# Patient Record
Sex: Male | Born: 1955 | Race: White | Hispanic: No | Marital: Married
Health system: Northeastern US, Academic
[De-identification: ages and names within clinical notes are randomized; demographics above are authoritative.]

---

## 2017-10-17 IMAGING — CR CHEST 2 VWS PA LAT
1 series · 2 of 2 positions shown · non-contrast
Comparison: None available

HISTORY/INDICATIONS: Dyspnea on exertion
TECHNIQUE: Chest 2 views.

[Series 1: pa · 0.17mm/px · 2 of 2 slices shown]
[im 1/2]
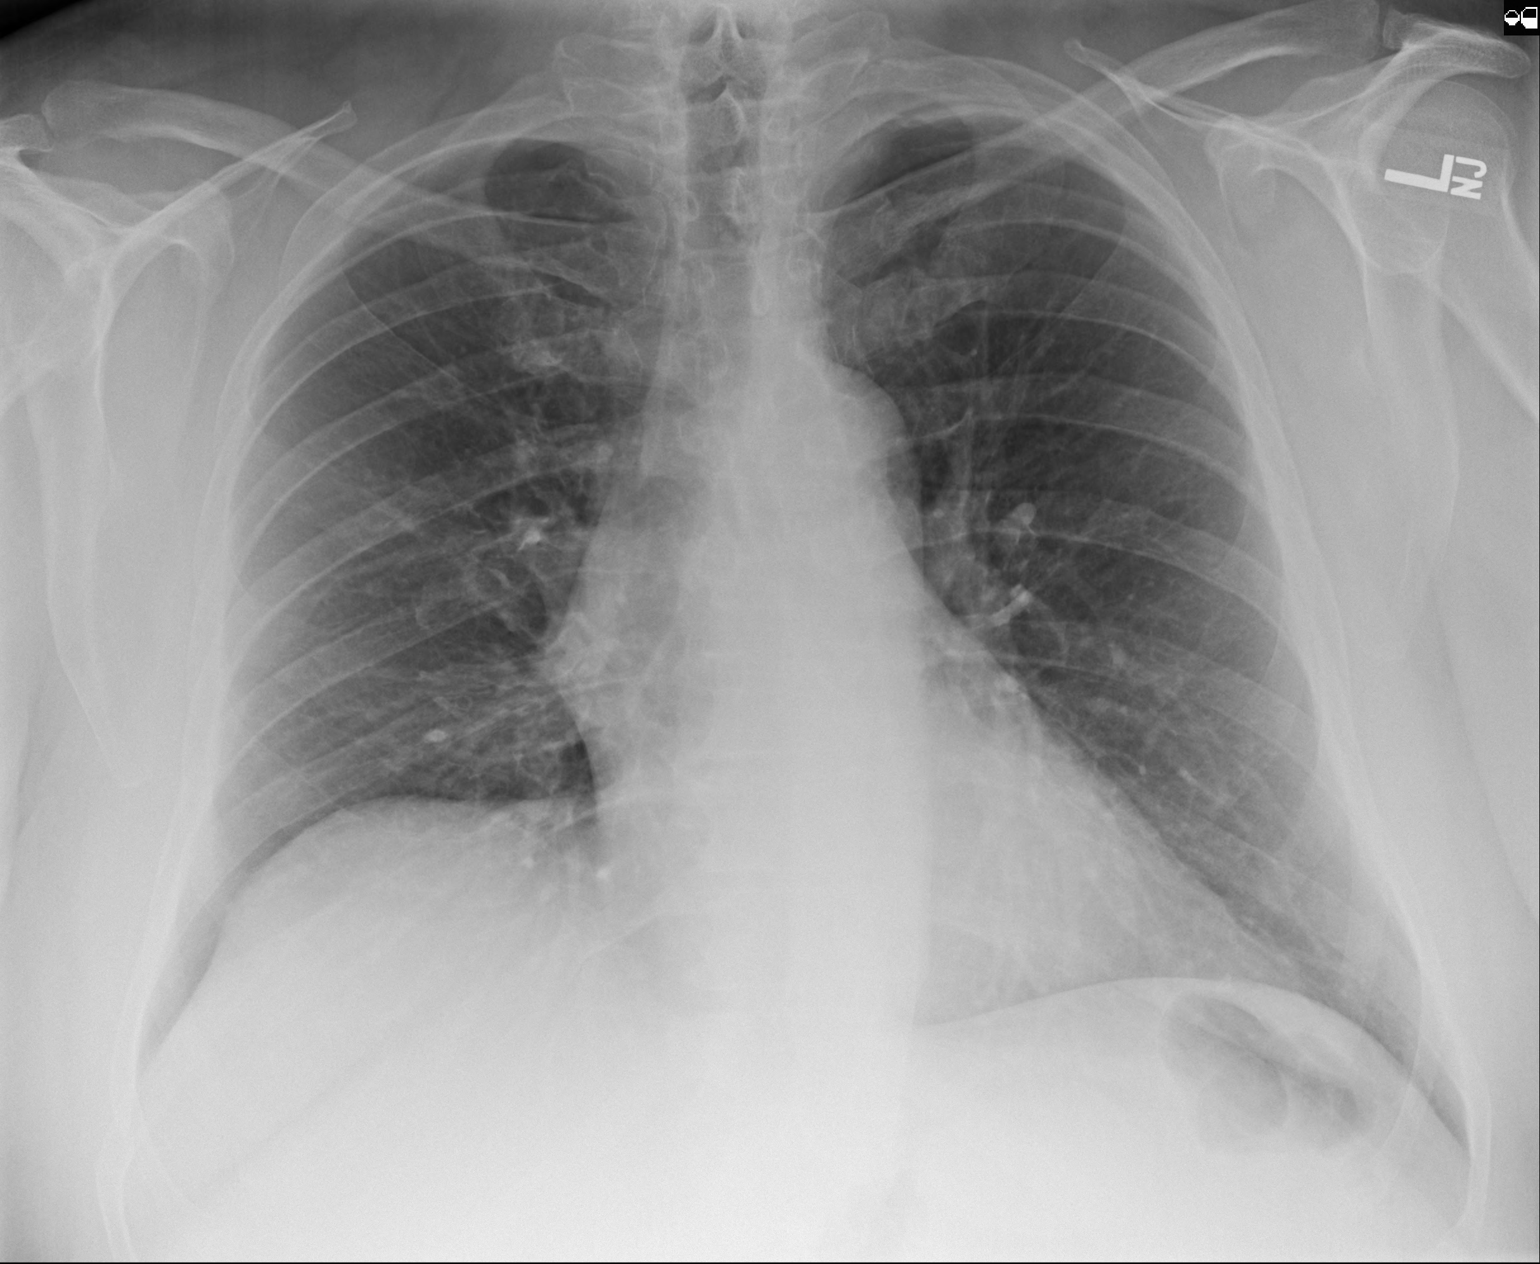
[im 2/2]
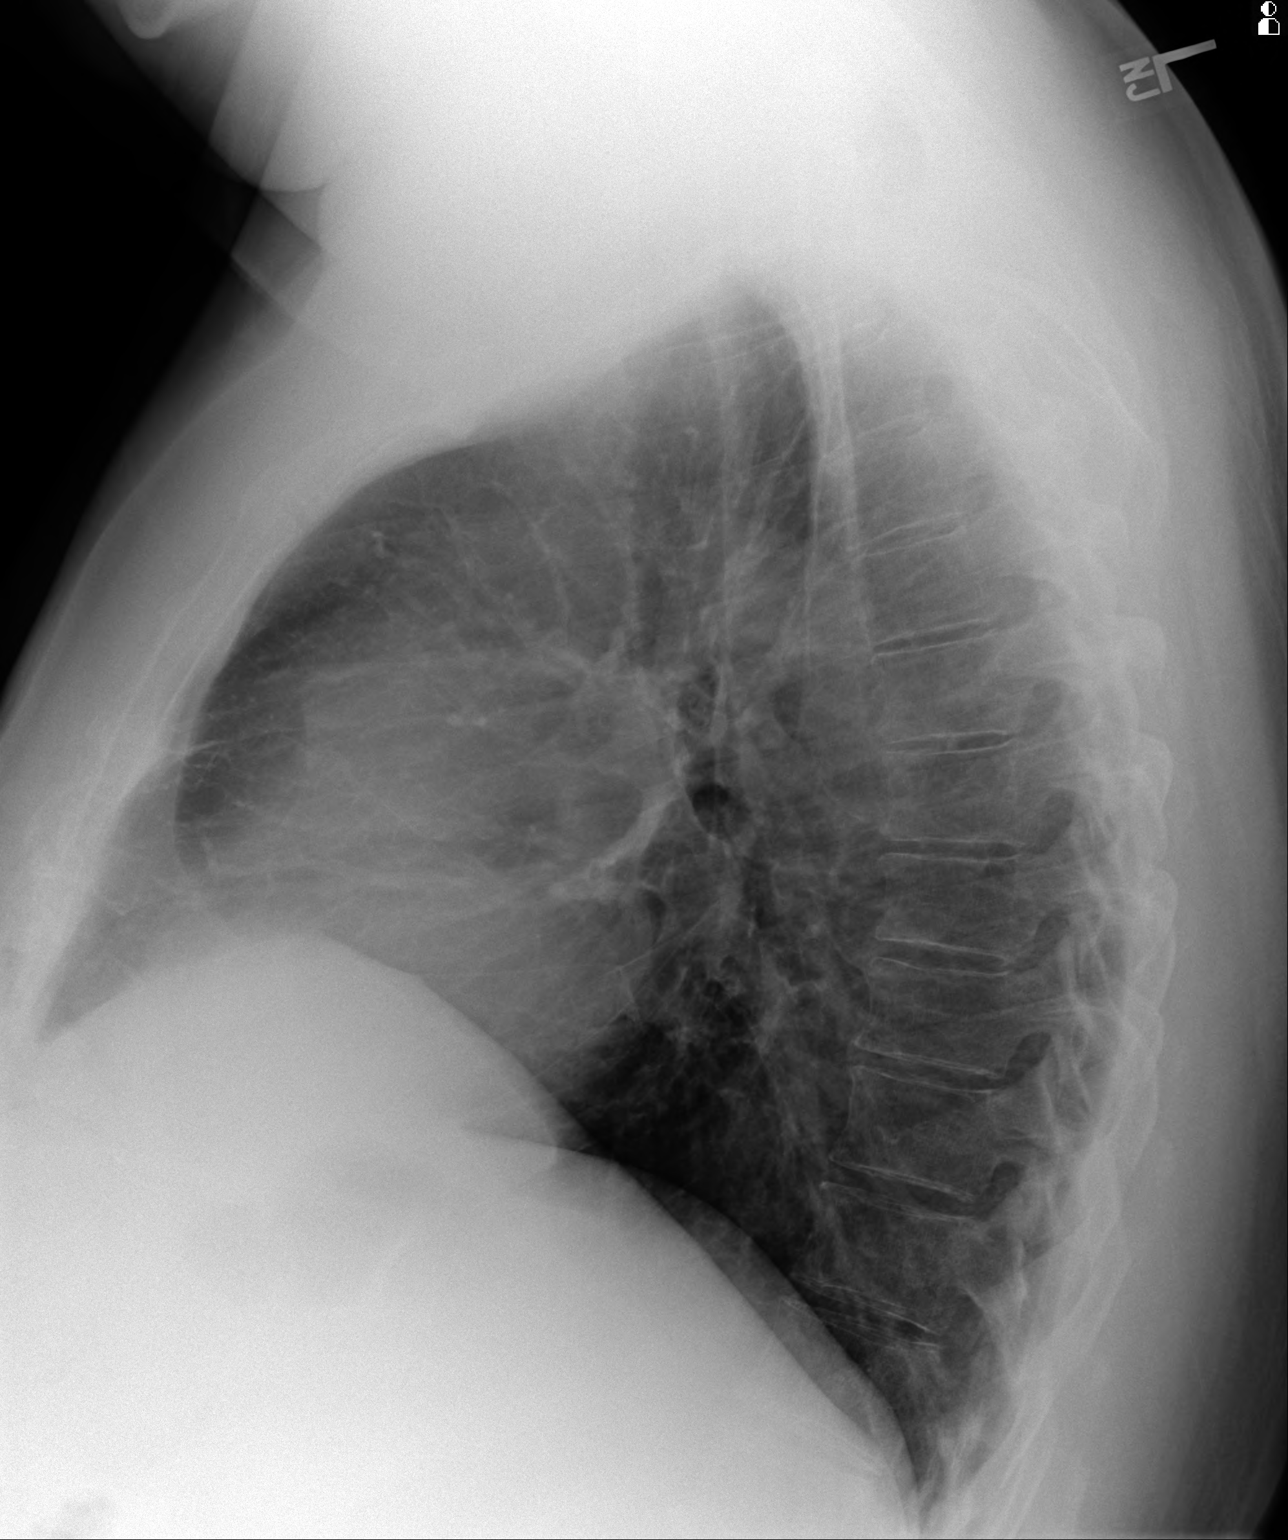

[2 of 2 positions shown; findings below may reference images not displayed]

FINDINGS: Heart size and pulmonary vasculature are normal. Lungs are clear bilaterally. There is no focal infiltrate, effusion or pneumothorax.

There is mild elevation of the right diaphragm.
IMPRESSION: No acute cardiopulmonary findings

## 2019-09-18 IMAGING — MR MRI KNEE LT WO CONTRAST
4 of 5 series · 29 of 40 positions shown · IV contrast (gadolinium)
Comparison: None

HISTORY: Left knee pain
TECHNIQUE: Multiplanar and multisequence MR imaging of the left knee was performed without the administration of intravenous gadolinium.

[Series 8: t2_axial_fs · axial · left · 3.0mm · 0.45mm/px · z∈[-51,+80]mm · 8 of 34 slices shown]
[im 1/34]
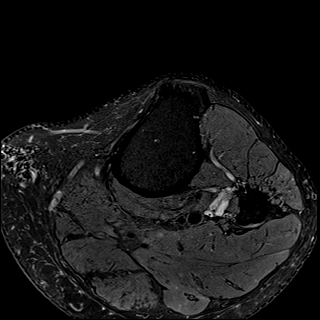
[im 5/34]
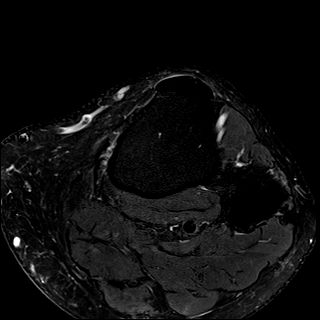
[im 10/34]
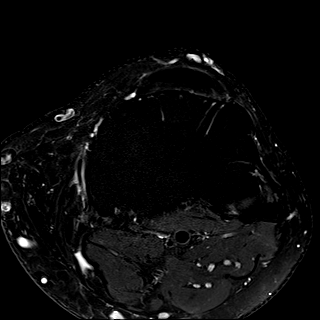
[im 15/34]
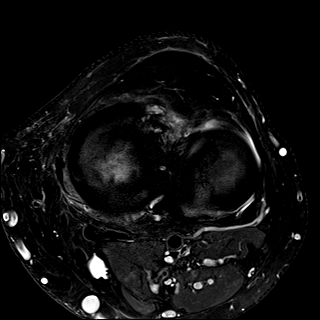
[im 19/34]
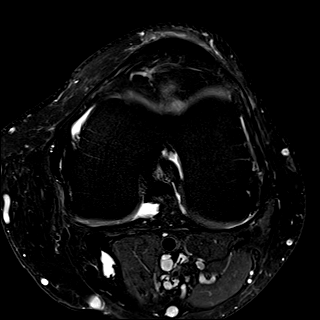
[im 24/34]
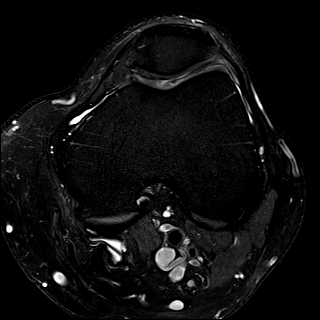
[im 29/34]
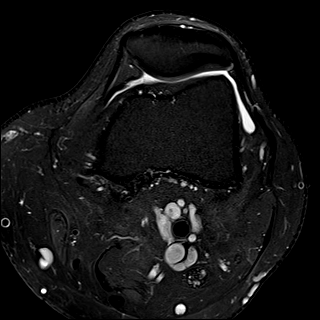
[im 34/34]
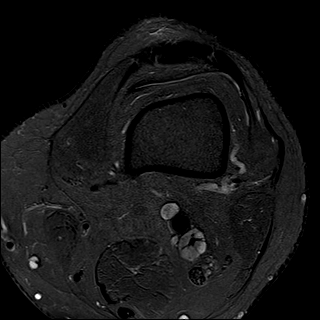

[Series 10: t1_cor · coronal · left · 3.5mm · 0.36mm/px · 8 of 30 slices shown]
[im 1/30]
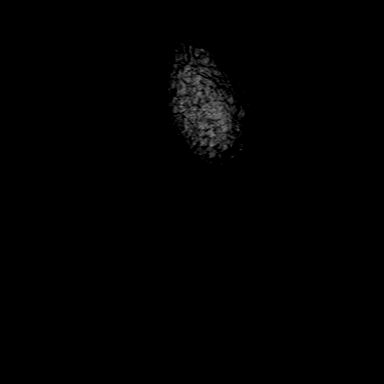
[im 5/30]
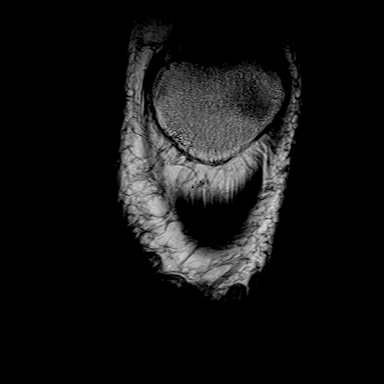
[im 9/30]
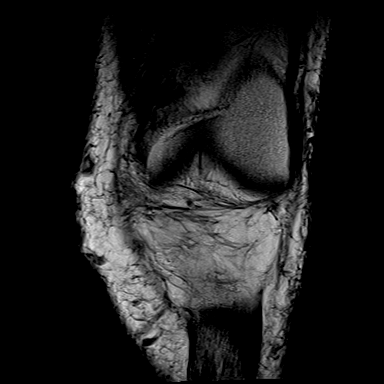
[im 13/30]
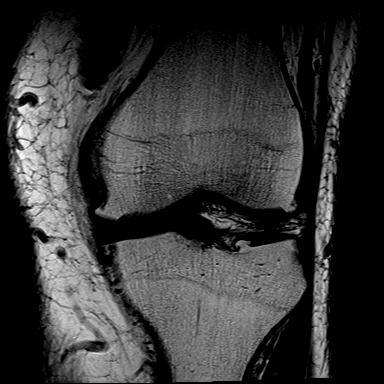
[im 17/30]
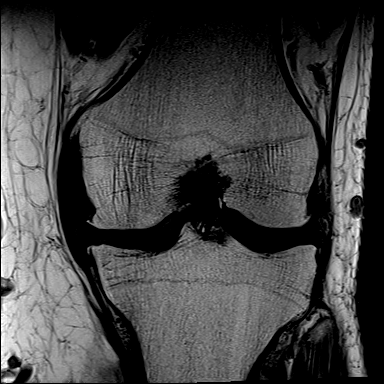
[im 21/30]
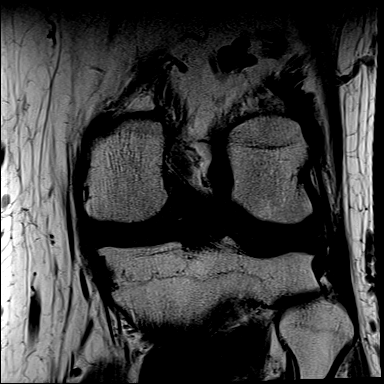
[im 25/30]
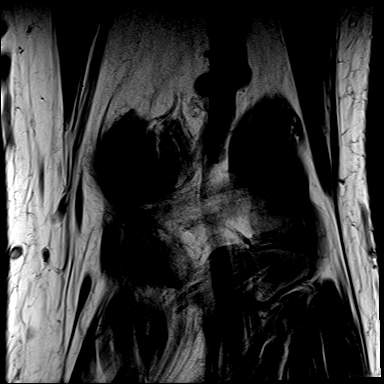
[im 30/30]
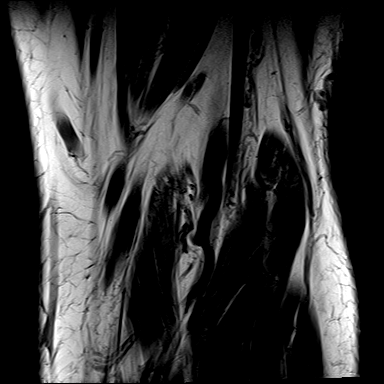

[Series 11: t2_cor_fs · coronal · left · 3.5mm · 0.44mm/px · 8 of 30 slices shown]
[im 1/30]
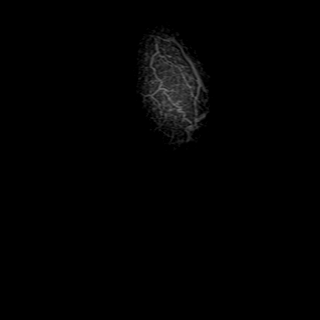
[im 5/30]
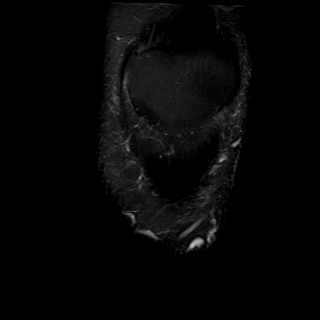
[im 9/30]
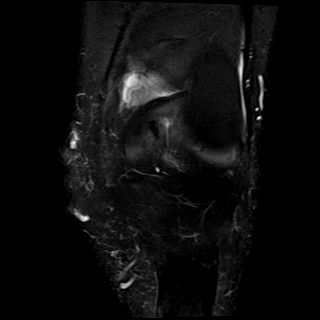
[im 13/30]
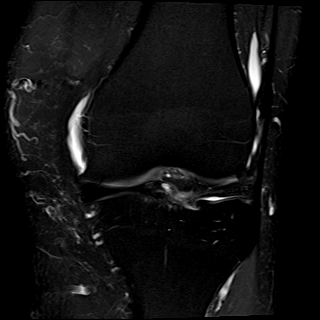
[im 17/30]
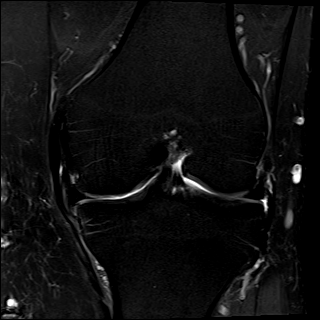
[im 21/30]
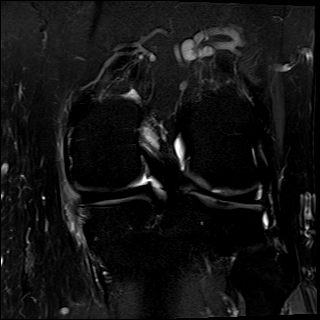
[im 25/30]
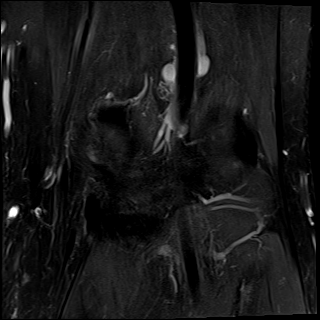
[im 30/30]
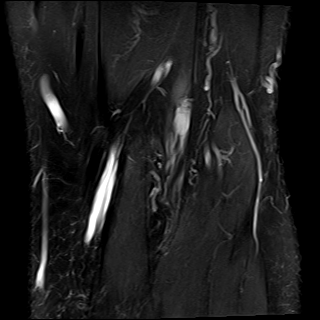

[Series 1011: pd_sag_fs · sagittal · left · 3.0mm · 0.23mm/px · 5 of 30 slices shown]
[im 1/30]
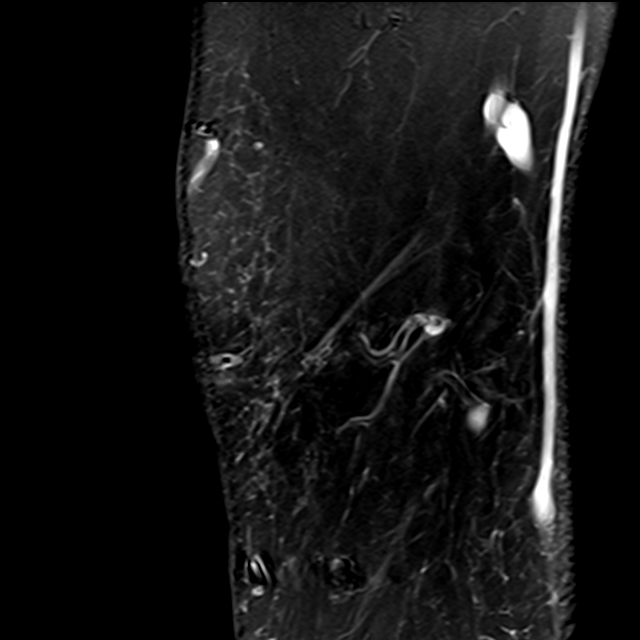
[im 5/30]
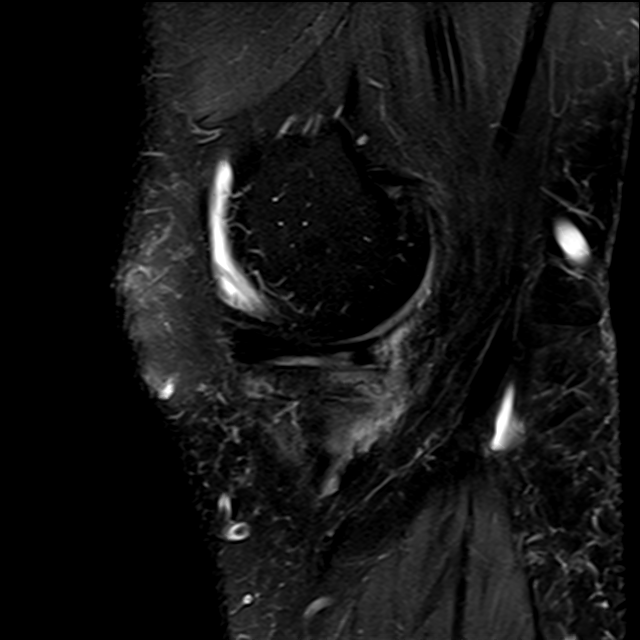
[im 9/30]
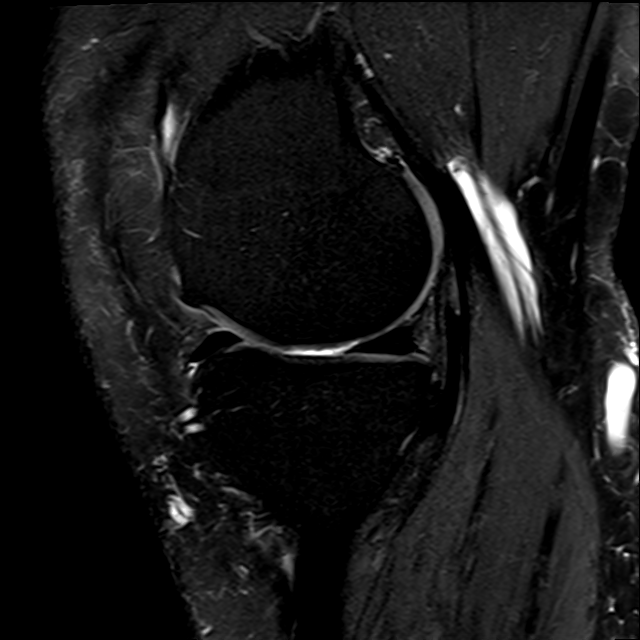
[im 17/30]
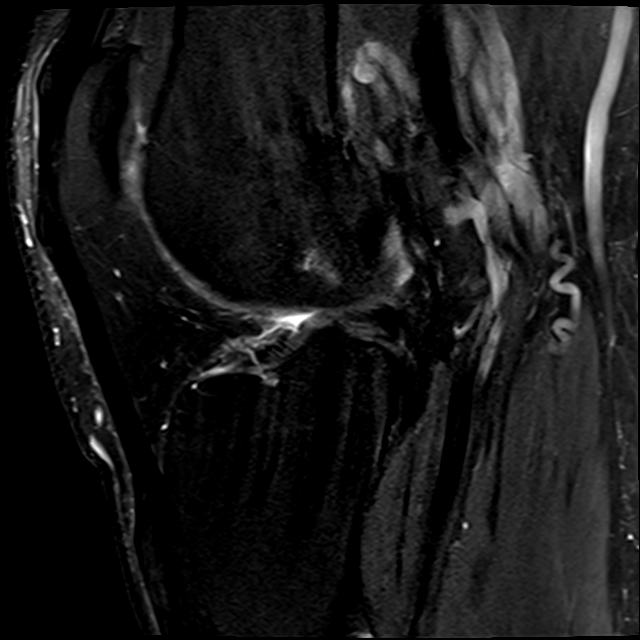
[im 25/30]
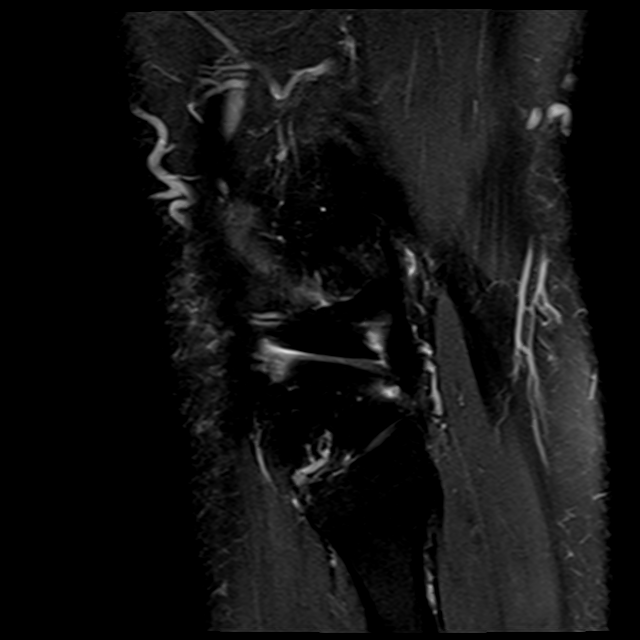

[29 of 40 positions shown; findings below may reference images not displayed]

FINDINGS: Chronic low-grade partial tear deep fibers of MCL with associated mild meniscocapsular separation and thickening of the femoral attachment.

The anterior cruciate, posterior cruciate and lateral collateral ligaments are intact and unremarkable.

Oblique undersurface tear at the junction of the posterior horn and body medial meniscus. Lateral meniscus unremarkable.

Small effusion.

Moderate chondral surface irregularity patellofemoral compartment with chondral fissures along the median ridge of the patella and trochlea. Mild to moderate chondral surface irregularity medial compartment. Lateral compartment articular surfaces are adequately maintained.

Osseous structures demonstrate no fractures or destructive lesions.

Extensor mechanism intact. Patellar retinacula are unremarkable. Signal from muscle normal. Muscle mass normal. Small Baker's cyst. No solid lesions. Multiple venous  varicosities.
IMPRESSION: 1. Oblique undersurface tear at the junction of the posterior horn and body medial meniscus.

2. Small effusion.

3. Moderate chondral surface irregularity patellofemoral compartment with chondral fissures along the median ridge of the patella and trochlea.

4. Chronic low-grade partial tear deep fibers of the medial collateral ligament with associated mild meniscocapsular separation.

## 2020-03-25 IMAGING — MR MRI KNEE LT WO CONTRAST
4 of 6 series · 25 of 40 positions shown · non-contrast
Comparison: MRI left knee, 09/18/2019.

INDICATION: Pain in left knee x several months, hx of surgery [DATE]
TECHNIQUE: Multiplanar, multisequence imaging of the left knee was performed without contrast.

[Series 5: t2_axial_fs · axial · left · 3.0mm · 0.45mm/px · z∈[-55,+69]mm · 7 of 32 slices shown]
[im 1/32]
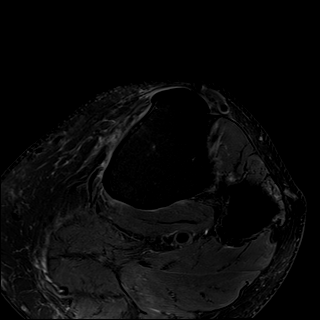
[im 6/32]
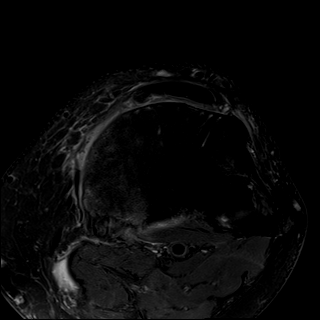
[im 11/32]
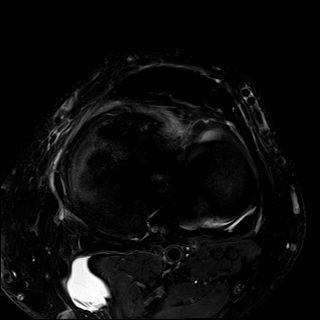
[im 16/32]
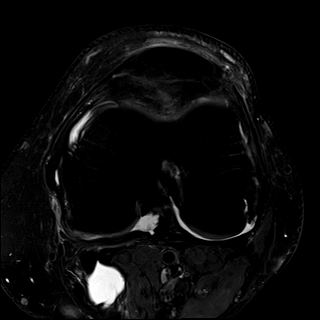
[im 21/32]
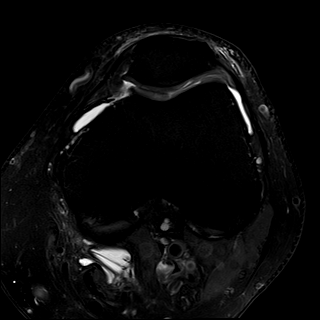
[im 26/32]
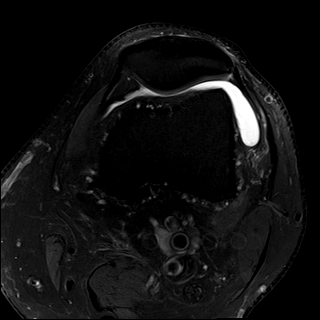
[im 32/32]
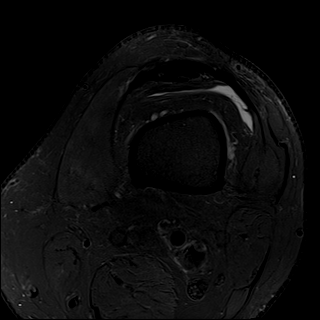

[Series 7: t1_cor · coronal · left · 3.5mm · 0.38mm/px · 6 of 25 slices shown]
[im 1/25]
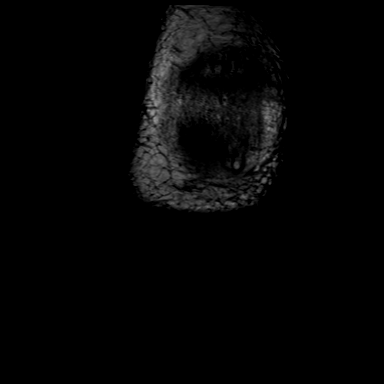
[im 5/25]
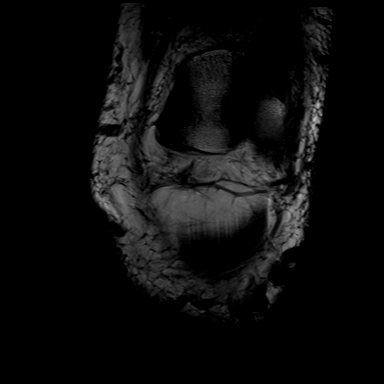
[im 10/25]
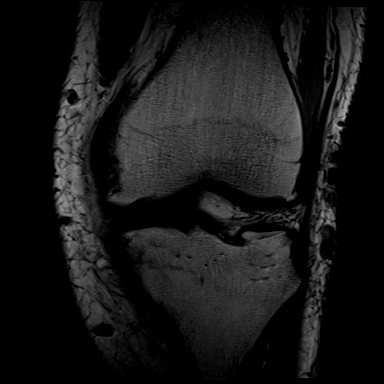
[im 15/25]
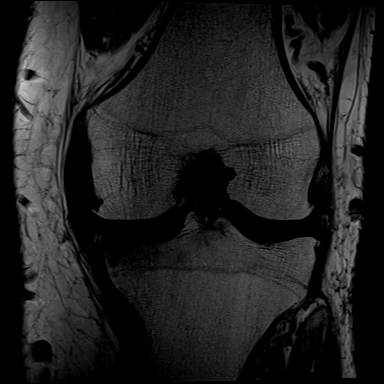
[im 20/25]
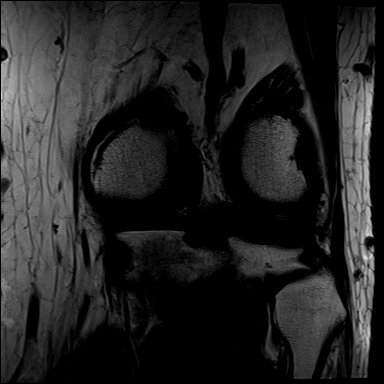
[im 25/25]
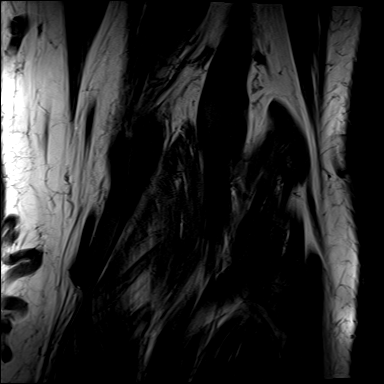

[Series 8: t2_fs_cor · coronal · left · 3.5mm · 0.45mm/px · 6 of 25 slices shown]
[im 1/25]
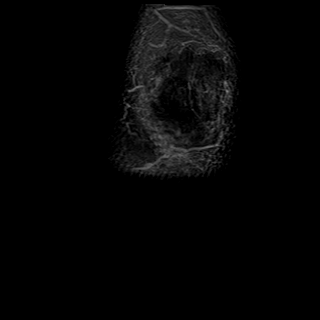
[im 5/25]
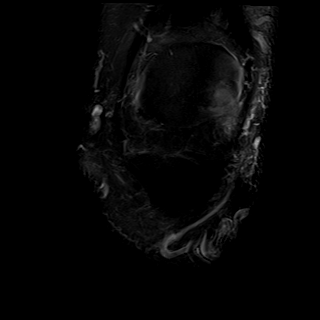
[im 10/25]
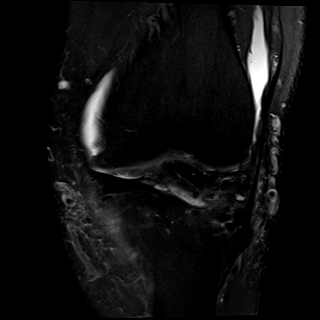
[im 15/25]
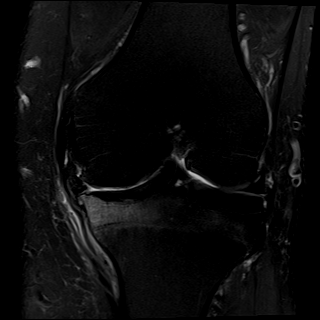
[im 20/25]
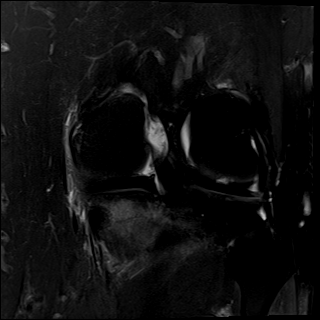
[im 25/25]
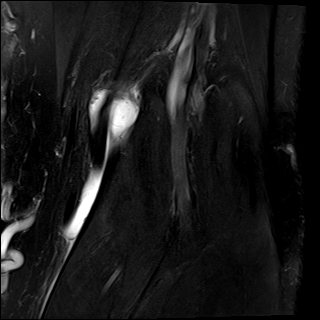

[Series 101: pd_sag_fs · sagittal · left · 3.0mm · 0.23mm/px · 6 of 29 slices shown]
[im 1/29]
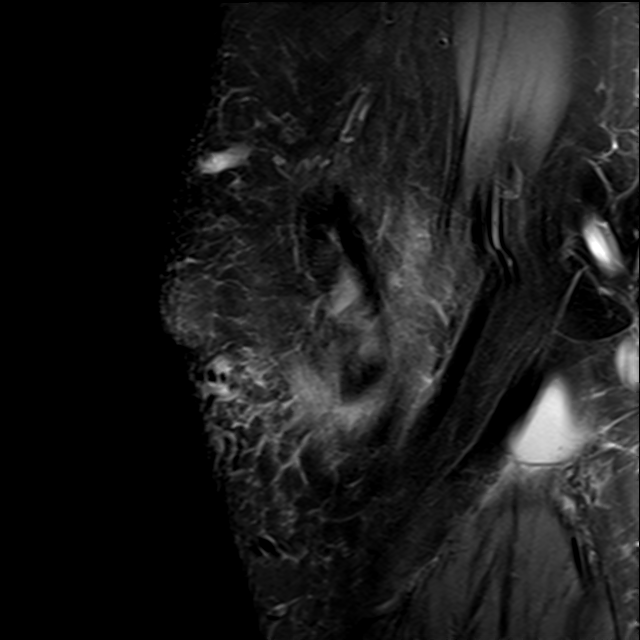
[im 6/29]
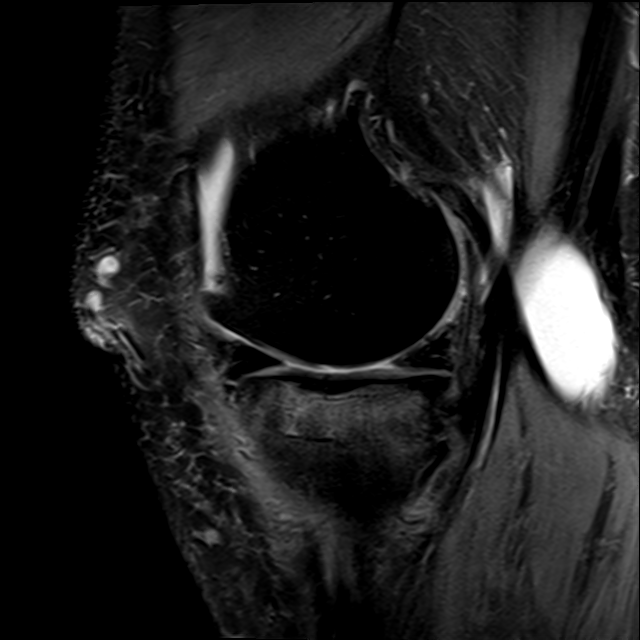
[im 12/29]
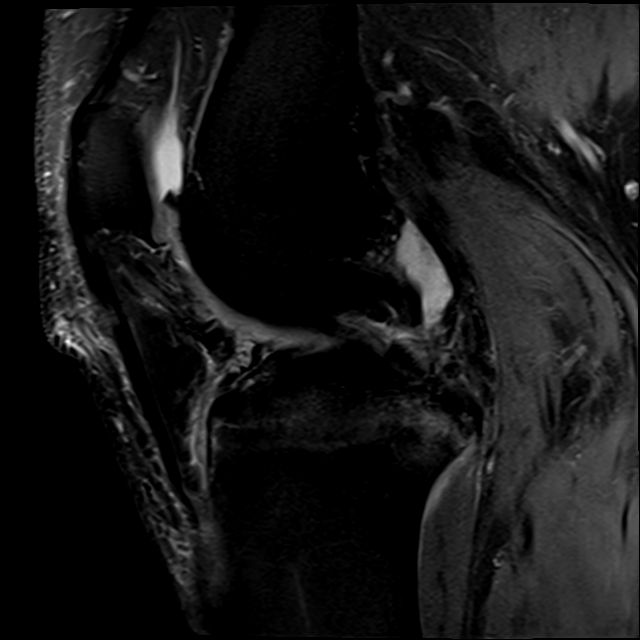
[im 17/29]
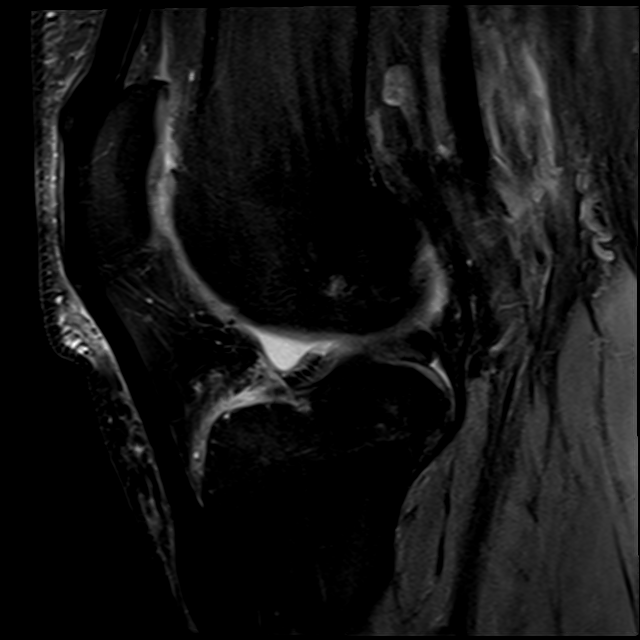
[im 23/29]
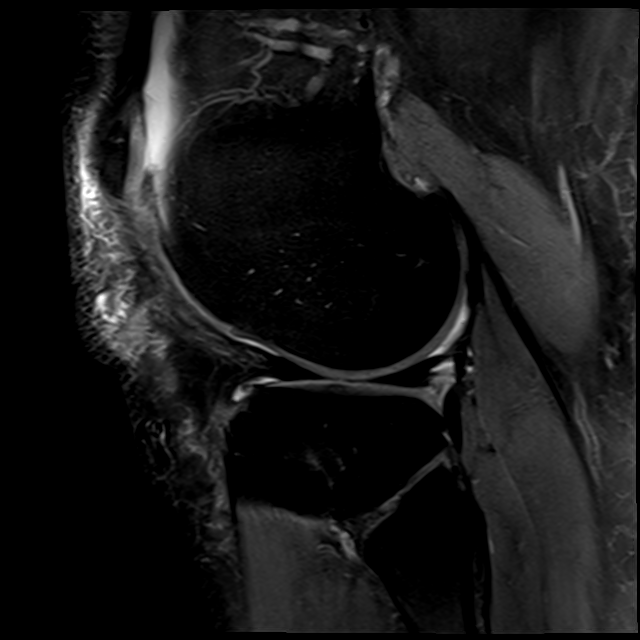
[im 29/29]
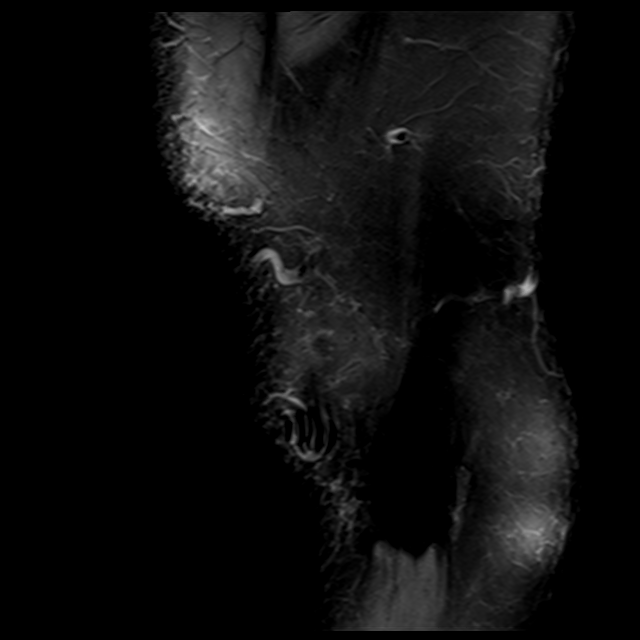

[25 of 40 positions shown; findings below may reference images not displayed]

FINDINGS: OSSEOUS: Interval development of subchondral stress/insufficiency fracture of the weightbearing medial tibial plateau measuring 14 x 13 mm, associated moderate edema within the medial tibial metaphysis.

MEDIAL JOINT COMPARTMENT:

Medial meniscus: Evidence of interval partial medial meniscectomy, with blunting of the central free edge of the posterior horn/body junction and posterior body of the medial meniscus. Partial extrusion of the medial meniscal body.

Articular cartilage: Moderate chondral thinning and irregularity of the weightbearing medial compartment articular cartilage, similar in appearance to prior examination. Small marginal osteophytes.

LATERAL JOINT COMPARTMENT:

Lateral meniscus: Intact.

Articular cartilage: Redemonstration of an unchanged subtle fraying of the central free edge body lateral meniscus.

PATELLOFEMORAL JOINT AND EXTENSOR MECHANISM:

Quadriceps tendon: The visualized portion of the distal quadriceps tendon is intact.

Patella tendon: Intact.

Articular cartilage: Moderate patellofemoral joint chondrosis, with redemonstration of deep partial-thickness chondral fissuring along the mid median patellar ridge. Deep partial-thickness chondral fissuring of the femoral trochlea.

Alignment: The alignment of the patellofemoral joint is within normal limits, in the imaged position.

LIGAMENTS:

Anterior cruciate ligament: Intact.

Posterior cruciate ligament: Intact.

Medial collateral ligament: Thickening of the superficial fibers of the medial collateral ligament, likely representing sequela of remote sprain.

Lateral collateral ligament: Intact.

MUSCULOTENDINOUS: The visualized musculotendinous soft tissues about the knee are unremarkable.

OTHER: Small to moderate knee joint effusion. Interval increase in size of a moderate popliteal fossa cyst which is partially decompressed. Nonspecific subcutaneous soft tissue edema is noted about the knee.
IMPRESSION: 1.
Evidence of interval partial medial meniscectomy, with partial extrusion of the medial meniscal body, and development of a large subchondral stress/insufficiency fracture of the lateral tibial plateau. Background moderate medial compartment chondrosis, similar in appearance to prior examination.

2.
Unchanged fraying of the central free edge body lateral meniscus. Mild lateral compartment chondrosis.

3.
Unchanged moderate patellofemoral joint chondrosis.

4.
Interval increase in size of a small to moderate joint effusion with moderate-sized popliteal fossa cyst which is partially decompressed.

## 2020-12-06 IMAGING — MR MRI KNEE RT WO CONTRAST
4 of 5 series · 29 of 40 positions shown · non-contrast
Comparison: None.

INDICATION: Right knee pain
TECHNIQUE: Multiplanar, multiecho imaging of the right knee was performed, including T1-weighted and fluid sensitive sequences without intravenous contrast administration.

[Series 12: t2_axial_fs · axial · right · 3.0mm · 0.45mm/px · z∈[-61,+67]mm · 8 of 33 slices shown]
[im 1/33]
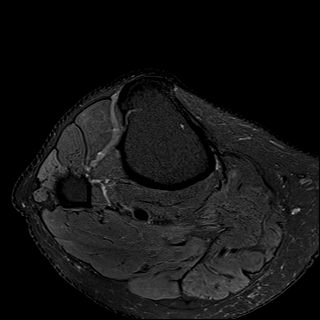
[im 5/33]
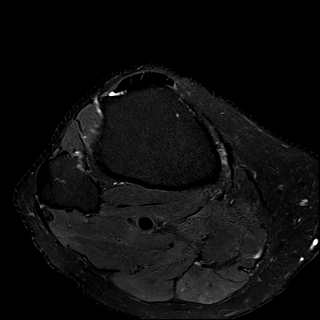
[im 10/33]
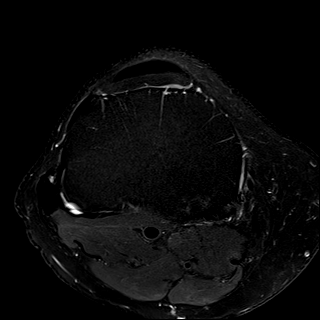
[im 14/33]
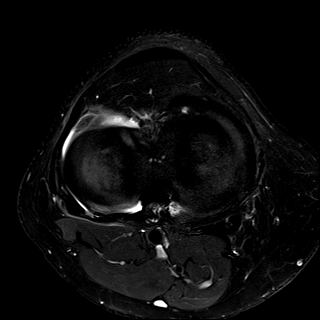
[im 19/33]
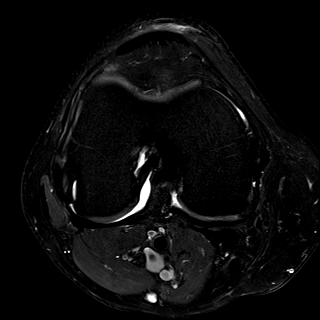
[im 23/33]
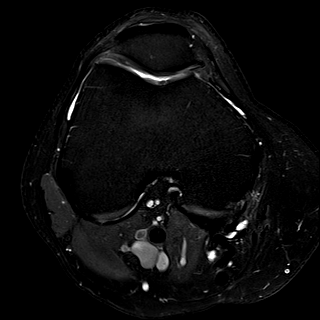
[im 28/33]
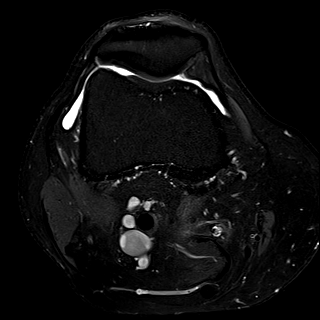
[im 33/33]
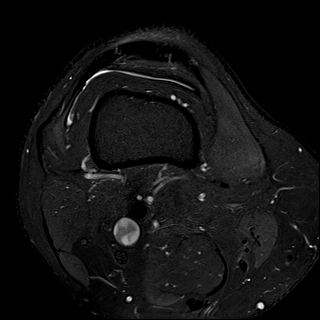

[Series 14: t1_cor · coronal · right · 3.5mm · 0.38mm/px · 8 of 30 slices shown]
[im 1/30]
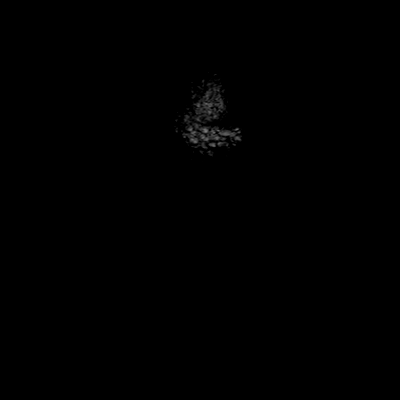
[im 5/30]
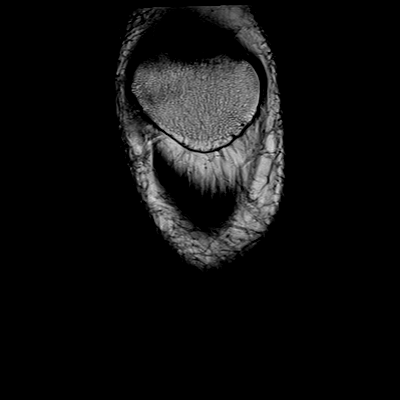
[im 9/30]
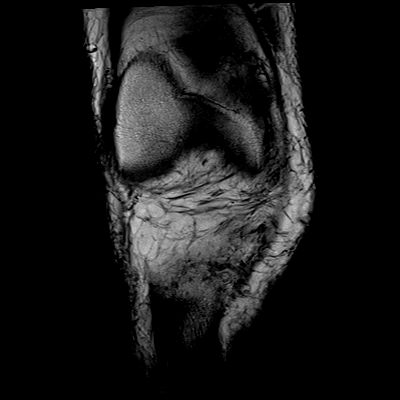
[im 13/30]
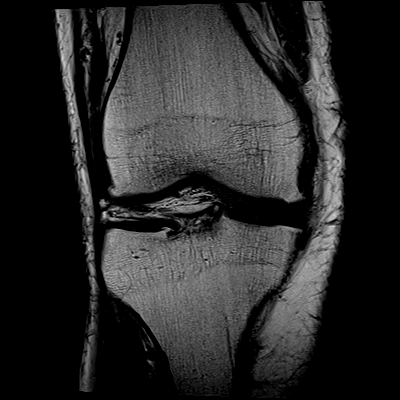
[im 17/30]
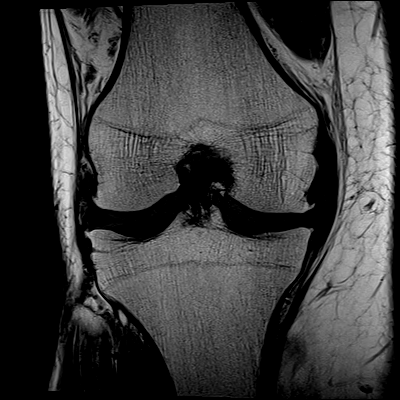
[im 21/30]
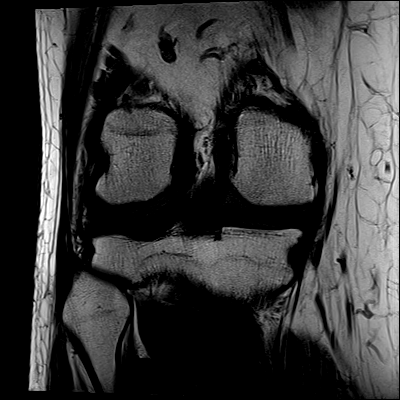
[im 25/30]
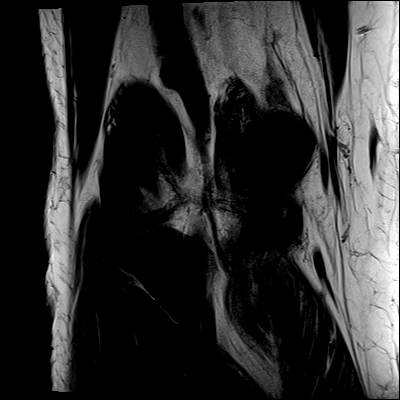
[im 30/30]
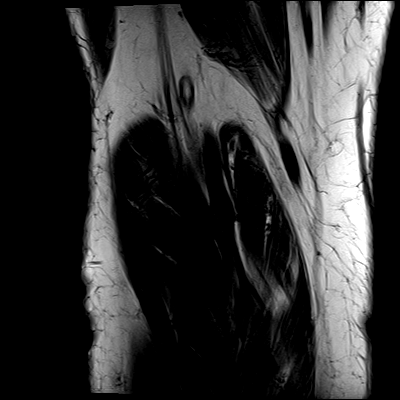

[Series 15: t2_cor_fs · coronal · right · 3.5mm · 0.52mm/px · 8 of 30 slices shown]
[im 1/30]
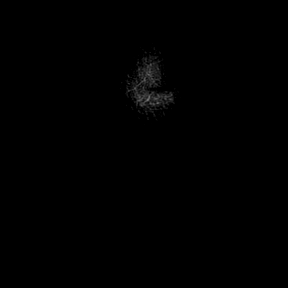
[im 5/30]
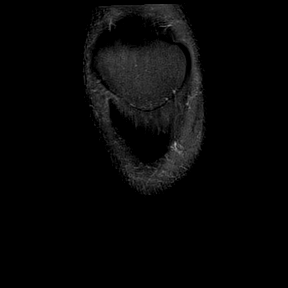
[im 9/30]
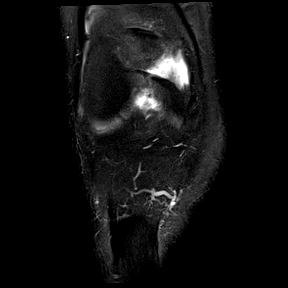
[im 13/30]
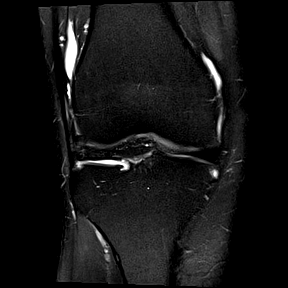
[im 17/30]
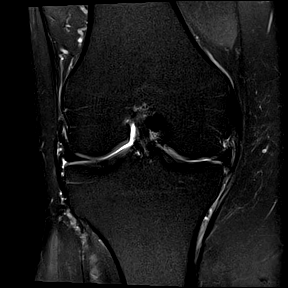
[im 21/30]
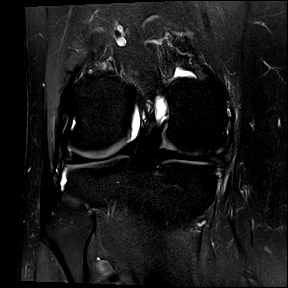
[im 25/30]
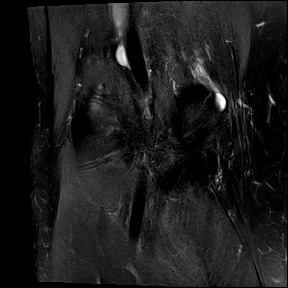
[im 30/30]
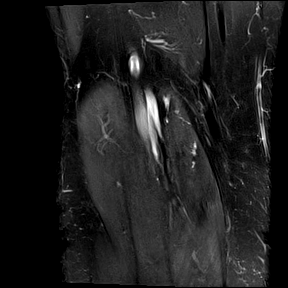

[Series 1016: pd_sag_fs_new · sagittal · right · 3.0mm · 0.23mm/px · 5 of 29 slices shown]
[im 1/29]
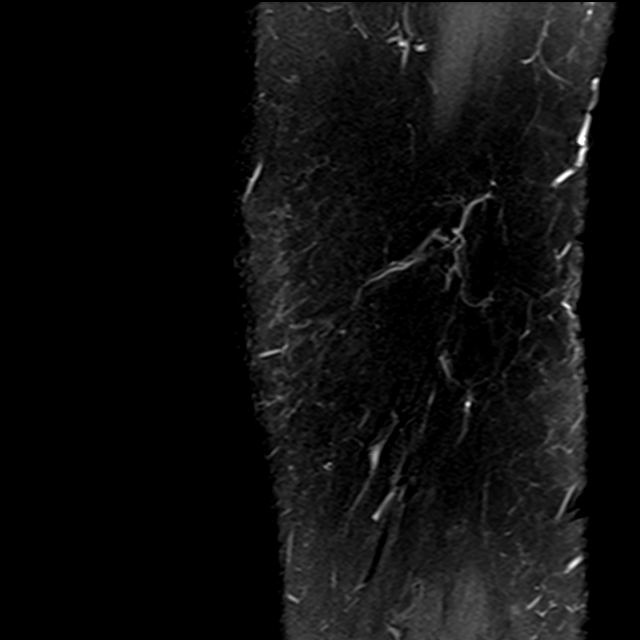
[im 5/29]
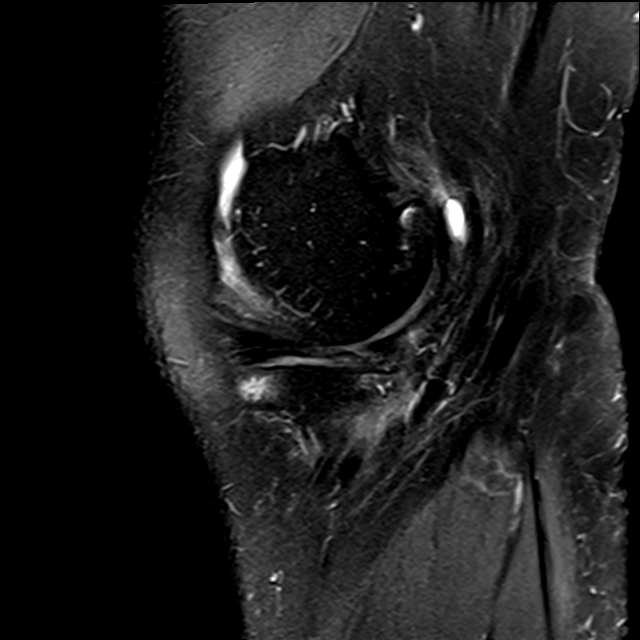
[im 9/29]
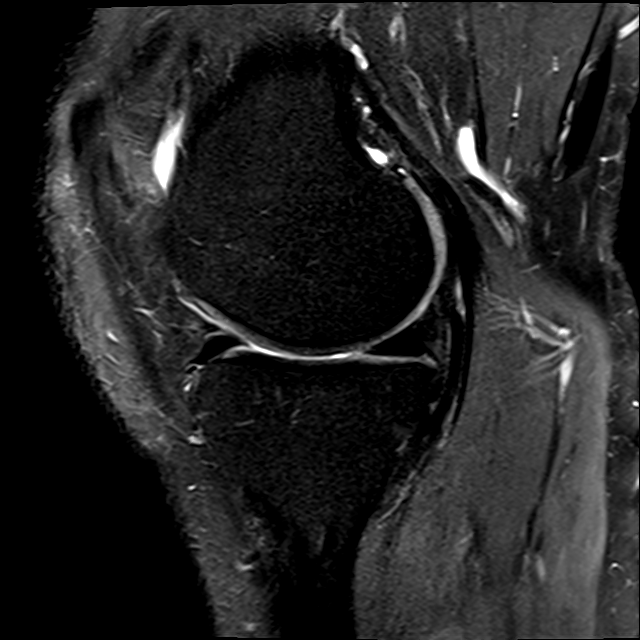
[im 17/29]
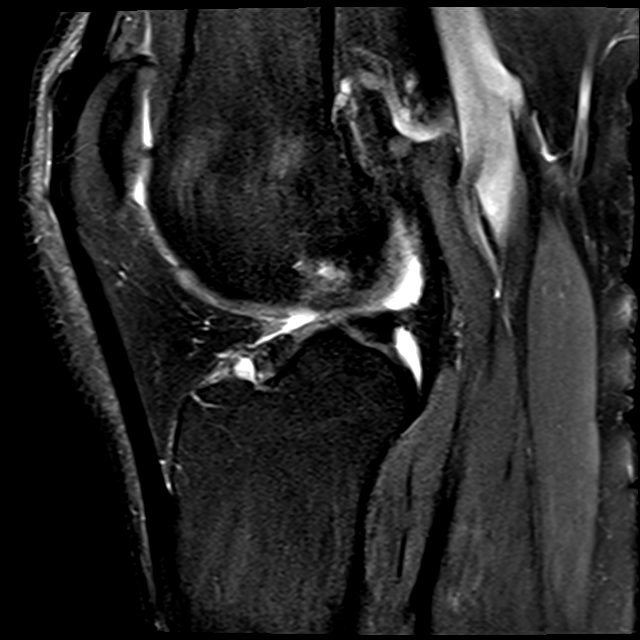
[im 25/29]
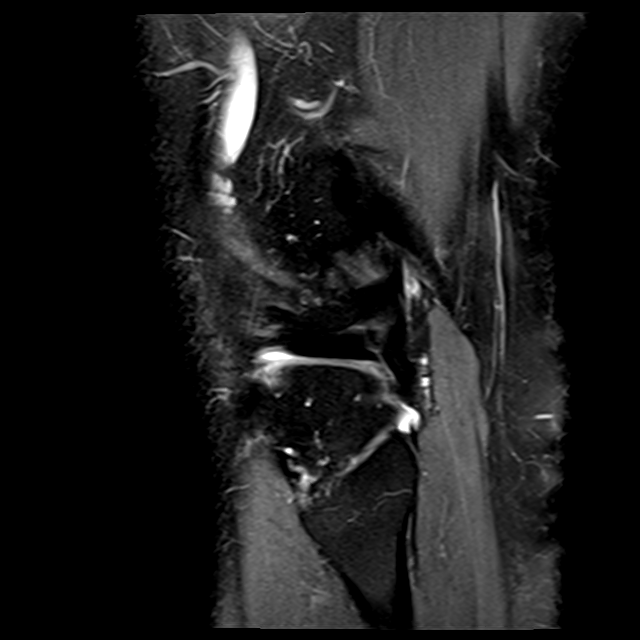

[29 of 40 positions shown; findings below may reference images not displayed]

FINDINGS: MEDIAL MENISCUS:  Complex tear of the body with radial free edge and oblique undersurface components, with displaced undersurface flap into the inferior medial gutter (series 15, image 19). Fraying at the free edge of the posterior horn.

LATERAL MENISCUS:  Intact.

ACL:  Intact.

PCL:  Intact.

MCL:  Intact.

LATERAL LIGAMENTS AND TENDONS:  Intact.

EXTENSOR MECHANISM:  The quadriceps and patellar tendons are intact.

FAT PADS:   Normal.

CARTILAGE:  

Patellofemoral compartment:  Extensive high-grade chondral loss throughout the entire compartment.

Medial compartment:  Extensive high-grade partial-thickness chondral loss at the weightbearing femoral condyle and outer aspect of the tibial plateau.

Lateral compartment: Intact.

BONE MARROW: Normal.

Trace joint effusion. Small Baker's cyst.
IMPRESSION: 1. Complex tear of the medial meniscus body, including displaced undersurface flap into the inferior medial gutter.

2. Moderate patellofemoral and medial compartment chondral abnormalities.

## 2022-07-07 ENCOUNTER — Inpatient Hospital Stay: Admit: 2022-07-07 | Discharge: 2022-07-07 | Payer: BLUE CROSS/BLUE SHIELD

## 2022-07-07 ENCOUNTER — Encounter: Admit: 2022-07-07 | Payer: PRIVATE HEALTH INSURANCE | Primary: Internal Medicine

## 2022-07-07 DIAGNOSIS — E119 Type 2 diabetes mellitus without complications: Secondary | ICD-10-CM

## 2022-07-07 DIAGNOSIS — I10 Essential (primary) hypertension: Secondary | ICD-10-CM

## 2022-07-07 DIAGNOSIS — I82409 Acute embolism and thrombosis of unspecified deep veins of unspecified lower extremity: Secondary | ICD-10-CM

## 2022-07-07 LAB — CBC WITH AUTO DIFFERENTIAL
BKR WAM ABSOLUTE IMMATURE GRANULOCYTES.: 0.01 x 1000/ÂµL (ref 0.00–0.30)
BKR WAM ABSOLUTE LYMPHOCYTE COUNT.: 1.49 x 1000/ÂµL (ref 0.60–3.70)
BKR WAM ABSOLUTE NEUTROPHIL COUNT.: 3.92 x 1000/ÂµL (ref 2.00–7.60)
BKR WAM BASOPHIL ABSOLUTE COUNT.: 0.04 x 1000/ÂµL (ref 0.00–1.00)
BKR WAM BASOPHILS: 0.6 % (ref 0.0–1.4)
BKR WAM EOSINOPHIL ABSOLUTE COUNT.: 0.08 x 1000/ÂµL (ref 0.00–1.00)
BKR WAM EOSINOPHILS: 1.3 % (ref 0.0–5.0)
BKR WAM HEMATOCRIT (2 DEC): 48.5 % (ref 38.50–50.00)
BKR WAM HEMOGLOBIN: 16.1 g/dL (ref 13.2–17.1)
BKR WAM IMMATURE GRANULOCYTES: 0.2 % (ref 0.0–1.0)
BKR WAM LYMPHOCYTES: 24 % (ref 17.0–50.0)
BKR WAM MCH PG: 30 pg (ref 27.0–33.0)
BKR WAM MCHC: 33.2 g/dL (ref 31.0–36.0)
BKR WAM MCV: 90.3 fL (ref 80.0–100.0)
BKR WAM MONOCYTE ABSOLUTE COUNT.: 0.67 x 1000/ÂµL (ref 0.00–1.00)
BKR WAM MONOCYTES: 10.8 % (ref 4.0–12.0)
BKR WAM MPV: 9.9 fL (ref 8.0–12.0)
BKR WAM NEUTROPHILS: 63.1 % (ref 39.0–72.0)
BKR WAM NUCLEATED RED BLOOD CELLS: 0 % (ref 0.0–1.0)
BKR WAM PLATELETS: 157 x1000/ÂµL (ref 150–420)
BKR WAM RDW-CV: 14.9 % (ref 11.0–15.0)
BKR WAM RED BLOOD CELL COUNT.: 5.37 M/ÂµL (ref 4.00–6.00)
BKR WAM WHITE BLOOD CELL COUNT: 6.2 x1000/ÂµL (ref 4.0–11.0)

## 2022-07-07 LAB — COMPREHENSIVE METABOLIC PANEL
BKR ALANINE AMINOTRANSFERASE (ALT): 27 U/L (ref 12–78)
BKR ALBUMIN: 4 g/dL (ref 3.4–5.0)
BKR ALKALINE PHOSPHATASE: 50 U/L (ref 45–117)
BKR ANION GAP (LM): 8 mmol/L (ref 5–15)
BKR ASPARTATE AMINOTRANSFERASE (AST): 18 U/L (ref 15–37)
BKR BILIRUBIN TOTAL: 0.6 mg/dL (ref 0.2–1.0)
BKR BLOOD UREA NITROGEN: 13 mg/dL (ref 7–18)
BKR CALCIUM: 9.3 mg/dL (ref 8.5–10.1)
BKR CHLORIDE: 107 mmol/L (ref 98–107)
BKR CO2: 24 mmol/L (ref 21–32)
BKR CREATININE: 1 mg/dL (ref 0.70–1.30)
BKR EGFR, CREATININE (CKD-EPI 2021): >60 mL/min/{1.73_m2} (ref >=60)
BKR GLOBULIN: 3.7 g/dL (ref 2.5–5.0)
BKR GLUCOSE: 111 mg/dL — ABNORMAL HIGH (ref 65–110)
BKR POTASSIUM: 4.3 mmol/L (ref 3.5–5.1)
BKR PROTEIN TOTAL: 7.7 g/dL (ref 6.4–8.2)
BKR SODIUM: 139 mmol/L (ref 136–145)

## 2022-07-07 MED ORDER — LANCETS
1 refills | Status: AC
Start: 2022-07-07 — End: ?

## 2022-07-07 MED ORDER — BLOOD SUGAR DIAGNOSTIC STRIPS
4 refills | Status: AC
Start: 2022-07-07 — End: ?

## 2022-07-07 MED ORDER — BLOOD-GLUCOSE METER
1 refills | Status: AC
Start: 2022-07-07 — End: ?

## 2022-07-07 MED ORDER — SODIUM CHLORIDE 0.9 % BOLUS (NEW BAG)
0.9 % | Freq: Once | INTRAVENOUS | Status: CP
Start: 2022-07-07 — End: ?
  Administered 2022-07-07: 16:00:00 0.9 mL/h via INTRAVENOUS

## 2022-07-07 NOTE — ED Provider Notes
Chief Complaint Patient presents with ? Hyperglycemia   Recently moved to the area and has not been able to get his Trilby Leaver; has not taken for 4 days.  This am his glucose reading was 500.   HPI/PE:66 year old male with history of NIDDM on Januvia and Jardiance who presents for evaluation of hyperglycemia over the last few days.  Patient states 4-5 days ago he ran out of Januvia.  Has not been able to get this filled at any of the local [facility] stores due to delivery delays.  He has been taking Jardiance alone. He reports feeling fatigued with cotton mouth over the last few days.  This prompted him to take his blood sugar which has been more elevated than his usual.  Yesterday his sugar was 400.  Today it was 500 which prompted him to come to the emergency department for evaluation. He reports possible increase in urinary frequency but states he has also been drinking a lot due to performing a lot of outdoor yard work.  He denies any subjective polydipsia.  He denies any fever/chills, chest pain, shortness of breath, cough or cold symptoms, nausea vomiting diarrhea, dysuria or symptoms of UTI.  Patient states his glucose has been very well controlled since diagnosis of DM in 2005.  He was transition from metformin to Januvia and Jiardiance due to GI side effects with metformin. He has been tolerating his current med regimen well. He just moved to this area from [place] a few weeks ago and does not have a PCP yet. UJW:JXBJ is a very pleasant 66 year old male with history of NIDDM who presents for possible hyperglycemia as noted above. On arrival FSBG is 134 and glucose on CMP is just 111. Labs otherwise unremarkable. No evidence of DKA. Pt does state he did just change the batteries in his personal glucometer a few days ago. His last home reading was less than 45 mins PTA. It is very likely this is an instrumentation issue. He will be provided with new Rx for home glucometer supplies. I did reach out directly to our patient resource navigator Asher Muir who will contact pt to assist with establishing with local PCP. At this time, I do not identify any acute life-threatening conditions. Patient is stable and appropriate for outpatient disposition. I have thoroughly discussed my evaluation, findings, and treatment plan with the patient. Patient verbalized understanding and agreed with the treatment plan, as well as final disposition. Patient was instructed to follow up as discussed on discharge instructions. Patient advised to return to the ED immediately for any worsening signs or symptoms or any acute changes. See discharge instructions for further patient care instructions.?I verbally discussed with the patient home care including rest,use of medications, and return precautions. An acute or life threatening problem was considered during this evaluation    Physical ExamED Triage Vitals [07/07/22 1148]BP: 125/80Pulse: (!) 57Pulse from  O2 sat: n/aResp: 18Temp: 98.4 ?F (36.9 ?C)Temp src: TemporalSpO2: 98 % BP 125/80  - Pulse (!) 57  - Temp 98.4 ?F (36.9 ?C) (Temporal)  - Resp 18  - Wt 105.4 kg  - SpO2 98% Physical ExamVitals reviewed. Constitutional:     General: He is not in acute distress.   Appearance: Normal appearance. He is not ill-appearing, toxic-appearing or diaphoretic. HENT:    Head: Normocephalic and atraumatic.    Right Ear: External ear normal.    Left Ear: External ear normal.    Nose: Nose normal.    Mouth/Throat:    Mouth: Mucous membranes are  moist. Eyes:    Conjunctiva/sclera: Conjunctivae normal. Pulmonary:    Effort: No respiratory distress.    Breath sounds: Normal breath sounds. No wheezing. Abdominal:    General: There is no distension.    Tenderness: There is no abdominal tenderness. Musculoskeletal:       General: Normal range of motion.    Cervical back: Normal range of motion and neck supple. Skin: General: Skin is warm and dry.    Capillary Refill: Capillary refill takes less than 2 seconds. Neurological:    General: No focal deficit present.    Mental Status: He is alert and oriented to person, place, and time. Psychiatric:       Mood and Affect: Mood normal.       Behavior: Behavior normal.  ProceduresAttestation/Critical CareClinical Impressions as of 07/07/22 1327 Type 2 diabetes mellitus without complication, without long-term current use of insulin (HC Code) (HC CODE) (HC Code)  ED DispositionDischargeSupervising physician:  Dr.Bahadory.  Mid-level provider saw patient independently. Please note that portions of this note may have been completed with a voice recognition dictation program. Efforts were made to ensure accuracy and edit the dictations, but errors may be present due to limitations of this software and occasionally words are mistranscribed. Melissa Montane, PA08/17/23 1327

## 2022-07-07 NOTE — Discharge Instructions
Rest, drink plenty of fluids. Get your diabetic medications filled as soon as possible. Take your blood sugar when you get home and if still reading elevated on your home glucometer, fill the script for the new glucometer. You will hear from our patient navigator Asher Muir regarding establishing with a new PCP in this area. Be seen sooner for any new or worsening symptoms especially fever/chills, cough, shortness of breath or burning with urination.

## 2022-07-28 ENCOUNTER — Encounter: Admit: 2022-07-28 | Payer: PRIVATE HEALTH INSURANCE | Primary: Internal Medicine

## 2022-07-28 DIAGNOSIS — I82461 Acute embolism and thrombosis of right calf muscular vein: Secondary | ICD-10-CM

## 2022-07-28 NOTE — Progress Notes
Referral by Dr Lu Duffel for warfarin management. Patient reports he has been anticoagulated with warfarin since 1997. He has warfarin 7.5 mg tablets. Initial clinic appointment scheduled for 08/01/22

## 2022-08-01 ENCOUNTER — Encounter
Admit: 2022-08-01 | Payer: PRIVATE HEALTH INSURANCE | Attending: Pharmacist Clinician (PhC)/ Clinical Pharmacy Specialist | Primary: Internal Medicine

## 2022-08-01 ENCOUNTER — Ambulatory Visit
Admit: 2022-08-01 | Payer: BLUE CROSS/BLUE SHIELD | Attending: Pharmacist Clinician (PhC)/ Clinical Pharmacy Specialist | Primary: Internal Medicine

## 2022-08-01 DIAGNOSIS — I82461 Acute embolism and thrombosis of right calf muscular vein: Secondary | ICD-10-CM

## 2022-08-01 DIAGNOSIS — E119 Type 2 diabetes mellitus without complications: Secondary | ICD-10-CM

## 2022-08-01 DIAGNOSIS — I82409 Acute embolism and thrombosis of unspecified deep veins of unspecified lower extremity: Secondary | ICD-10-CM

## 2022-08-01 DIAGNOSIS — I10 Essential (primary) hypertension: Secondary | ICD-10-CM

## 2022-08-01 MED ORDER — JARDIANCE 10 MG TABLET
10 mg | Freq: Every day | ORAL | Status: AC
Start: 2022-08-01 — End: ?

## 2022-08-01 MED ORDER — JANUVIA 100 MG TABLET
100 mg | Freq: Every day | ORAL | Status: AC
Start: 2022-08-01 — End: ?

## 2022-08-01 MED ORDER — LISINOPRIL 10 MG TABLET
10 mg | Freq: Every day | ORAL | Status: AC
Start: 2022-08-01 — End: ?

## 2022-08-01 MED ORDER — TADALAFIL 20 MG TABLET
20 mg | Freq: Every day | ORAL | Status: AC | PRN
Start: 2022-08-01 — End: ?

## 2022-08-01 MED ORDER — LORAZEPAM 0.5 MG TABLET
0.5 mg | Freq: Every day | ORAL | Status: AC | PRN
Start: 2022-08-01 — End: ?

## 2022-08-01 MED ORDER — WARFARIN 7.5 MG TABLET
7.5 mg | Freq: Every day | ORAL | Status: AC
Start: 2022-08-01 — End: ?

## 2022-08-01 MED ORDER — OMEPRAZOLE 40 MG CAPSULE,DELAYED RELEASE
40 mg | ORAL | Status: AC
Start: 2022-08-01 — End: ?

## 2022-08-01 MED ORDER — AMLODIPINE 5 MG TABLET
5 mg | ORAL | Status: AC
Start: 2022-08-01 — End: ?

## 2022-08-01 NOTE — Progress Notes
Pharmacist Anticoagulation Management Patient EnrollmentWarfarin indication:   ICD-10-CM  1. 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 Anthonette Legato lab [facility] placed [facility]: [facility] labLeonard [name] is on chronic warfarin and is being transferred to our clinic from [place].See warfarin dosing calendar for dosing.Bridging with another anticoagulant: no.Next INR assessment due in: 4 weeksThe patient reports being on warfarin since 1994 and demonstrated competency regarding therapy.  Extensive warfarin education was deferred in light of this.  He was given an Production manager including information on warfarin dosing, administration, importance of follow-up monitoring, medication, dietary, and alcohol interactions, and signs and symptoms of bleeding/bruising and clotting complications.  He agreed to review and contact clinic with any questions. They were instructed to contact clinic if scheduled for any upcoming invasive procedures or if there are any changes to health, medications, diet, alcohol use, missed warfarin doses or any other changes that could affect warfarin therapy. Reviewed the following with the patient:?	Home medication list was reviewed and updated: Yes?	Interacting medications/supplements: none?	Bleeding history: no?	Social history: o	Current alcohol use: yes -  Socially (a few per week)o	Cigarette smoking: noo	Medical/recreational marijuana: yes -  A few per week, counseled may increase [employer] barriers: noo	Barriers to procuring medications: no?	Other patient-specific comments: noneClinic communication:?	Provided Andrew [name] with the clinic's contact information, hours of operation, off-hours contact information.  ?	Andrew [name] granted permission to leave detailed voice messages with INR results, warfarin dosing instructions and personal medical information at [phone] (home) :   yes  ?	The patient also grants permission to discuss all anticoagulation-related care with Andrew [name] (Spouse).  ?	The patient is aware that they will be discharged from clinic if they do not adhere to routine INR testing, maintain direct communication with clinic, or update the clinic of changes to contact information.Electronically Signed by Andrew [name], PharmD, 08/01/2022 [facility]  [address]  [place]  Phone: [phone]  Fax: [phone]

## 2022-08-01 NOTE — Progress Notes
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  He presents to the clinic today, 08/01/2022, for routine INR monitoring. Patient's INR findings and prescribed dose from last visit:  08/01/2022  10:00 AM [facility] INR Date 08/01/2022 Associated INR 2.7 Instructions 7.5 mg every day Sunday Dose 7.5 mg Monday Dose 7.5 mg Tuesday Dose 7.5 mg Wednesday Dose 7.5 mg Thursday Dose 7.5 mg Friday Dose 7.5 mg Saturday Dose 7.5 mg Last 7 day dose total 0 mg Next INR Date 08/31/2022 The following findings were reported from today's visit (no findings if none selected):[]  Missed doses []  Extra doses []  Change in medications []  Change in vitamin k rich food intake (green vegetables, herbals, etc) []  Change in alcohol use []  Change in overall health []  Recent hospitalization / emergency department visit []  Upcoming procedure (including dental procedures) []  Other Comments:       No findings The following side effects were reported from today's visit (no side effects if none selected):[]  Bleeding []  New bruising []  Warfarin-emergency department visit or hospital admission []  Other Comments:   No side effects Assessment/Plan:Patient's INR is 2.7, which is within goal range of 2.0-3.0.  This is patient's initial visit to this clinic.  See separate enrollment note.  He recently moved to [place] from [place].  He was referred by new PCP Dr. Lu Duffel.  Patient reports long history with warfarin.  DVT 1994- warfarin x 90 days.  DVT 1995 - warfarin x 6 months.  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  Patient reports he has been taking warfarin 7.5 mg daily for some time now.  His last INR check was several months ago back in [place] and he recently established local care.  He reports his last INR was 2.5 at that time. He reports he has 7.5 mg tablets at home.  Can see previous fill history for other strengths.  He confirms he is only using 7.5 mg tablets at this time.  He was counseled to call this clinic with need for future refills.  Clinic may consider adjusting to a smaller tablet size and he would be agreeable to this.  His med list was updated, no recent changes.  He did got to a local ED about 3 weeks ago due to high glucometer readings, it was determined his glucometer was broken at that time.  He was instructed to continue current weekly dose as indicated in the maintenance plan below.  Next INR assessment due 4 weeks. This information was routed to the covering provider for review.Anticoagulation Summary  As of 08/01/2022  INR goal:  2.0-3.0 TTR:  -- INR used for dosing:  2.7 (08/01/2022) Warfarin maintenance plan:  7.5 mg (7.5 mg x 1) every day Weekly warfarin total:  52.5 mg Plan last modified:  [name], PharmD (08/01/2022) Next INR check:  08/31/2022 Priority:  Maintenance Target end date:  Indefinite  Indications  Deep vein thrombosis (DVT) of calf muscle vein of right lower extremityunspecified chronicity (HC Code) [I82.461]   Anticoagulation Episode Summary   INR check location:  POCT (Point of Care Test)  Preferred lab:  -[facility]  Send INR reminders to:  [facility] OUTPT INR POOL  Comments:    Anticoagulation Care Providers   Provider Role Specialty Phone number  Rolfe,  Currie Paris, DO Referring Internal Medicine [phone]  Raelyn Mora, MD Responsible Cardiovascular Dis [phone]  All patient's medications have been reviewed and updated as needed.  I spent 15 min on direct patient education and consulting.The following education was provided for new patient education:Dosing instructions, Administration considerations (e.g., missed dose management, consistent dosing time), Indication for warfarin, Blood tests and importance of follow-up monitoring, Interacting medications, Dietary considerations while taking warfarin and Safety considerations while taking warfarin.Electronically Signed by [name], PharmD, September 11, 2023[facility]  [address]  [place]  Phone: [phone]  Fax: [phone]

## 2022-08-31 ENCOUNTER — Encounter
Admit: 2022-08-31 | Payer: PRIVATE HEALTH INSURANCE | Attending: Pharmacist Clinician (PhC)/ Clinical Pharmacy Specialist | Primary: Internal Medicine

## 2022-08-31 ENCOUNTER — Ambulatory Visit
Admit: 2022-08-31 | Payer: BLUE CROSS/BLUE SHIELD | Attending: Pharmacist Clinician (PhC)/ Clinical Pharmacy Specialist | Primary: Internal Medicine

## 2022-08-31 DIAGNOSIS — E119 Type 2 diabetes mellitus without complications: Secondary | ICD-10-CM

## 2022-08-31 DIAGNOSIS — I82409 Acute embolism and thrombosis of unspecified deep veins of unspecified lower extremity: Secondary | ICD-10-CM

## 2022-08-31 DIAGNOSIS — I10 Essential (primary) hypertension: Secondary | ICD-10-CM

## 2022-08-31 NOTE — Progress Notes
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  He presents to the clinic today, 08/31/2022, for routine INR monitoring. Patient's INR findings and prescribed dose from last visit:  08/01/2022  10:00 AM 08/31/2022   9:50 AM [facility] Monitoring [facility] INR Date 08/01/2022 08/31/2022 Associated INR 2.7 1.7 Instructions 7.5 mg every day 10/12: 11.25 mg; Otherwise 7.5 mg every day Sunday Dose 7.5 mg 7.5 mg Monday Dose 7.5 mg 7.5 mg Tuesday Dose 7.5 mg 7.5 mg Wednesday Dose 7.5 mg 7.5 mg Thursday Dose 7.5 mg 11.25 mg (10/12) Friday Dose 7.5 mg 7.5 mg Saturday Dose 7.5 mg 7.5 mg Last 7 day dose total 0 mg 52.5 mg Next INR Date 08/31/2022 09/07/2022 The following findings were reported from today's visit (no findings if none selected):[]  Missed doses []  Extra doses []  Change in medications [x]  Change in vitamin k rich food intake (green vegetables, herbals, etc) [x]  Change in alcohol use []  Change in overall health []  Recent hospitalization / emergency department visit []  Upcoming procedure (including dental procedures) [x]  Other Comments:       See below The following side effects were reported from today's visit (no side effects if none selected):[]  Bleeding []  New bruising []  Warfarin-emergency department visit or hospital admission []  Other Comments:   No side effects Assessment/Plan:Patient's INR is 1.7, which is below goal range of 2.0-3.0.  Patient reports more greens, more activity and less alcohol- all which may decrease INR.  He was instructed to increase weekly dose by 7% as indicated in the maintenance plan below, as limited by tablet size.  Next INR assessment due 1 week. This information was routed to the covering provider for review.  Patient counseled on increase risk of blood clot and to avoid greens x 24 hours.  Already took dose today, so tomorrows dose increased.  Consult to Dr. Raelyn Mora, MD, clinic medical director, for 7% dose increase for INR 1.7 as limited by tablet size.Anticoagulation Summary  As of 08/31/2022  INR goal:  2.0-3.0 TTR:  62.5 % (3.4 wk) INR used for dosing:  1.7 (08/31/2022) Warfarin maintenance plan:  7.5 mg (7.5 mg x 1) every day Weekly warfarin total:  52.5 mg Plan last modified:  Andrew [name], PharmD (08/01/2022) Next INR check:  09/07/2022 Priority:  Maintenance Target end date:  Indefinite  Indications  Deep vein thrombosis (DVT) of calf muscle vein of right lower extremityunspecified chronicity (HC Code) [I82.461]   Anticoagulation Episode Summary   INR check location:  POCT (Point of Care Test)  Preferred lab:  [facility]  Send INR reminders to:  [facility] OUTPT INR POOL  Comments:    Anticoagulation Care Providers   Provider Role Specialty Phone number  Andrew Quaker, DO Referring Internal Medicine [phone]  Andrew Mora, MD Responsible Cardiovascular Dis [phone]  All patient's medications have been reviewed and updated as needed.  I spent 15 min on direct patient education and consulting.The following education was provided for reeducation for out of range [id number] instructions, Administration considerations (e.g., missed dose management, consistent dosing time), Indication for warfarin, Blood tests and importance of follow-up monitoring, Signs/symptoms of clot or stroke, Dietary considerations while taking warfarin and Safety considerations while taking warfarin.Electronically Signed by Andrew [name], PharmD, October 11, 2023[facility]  [address]  Phone: [phone]  Fax: [phone]

## 2022-09-01 DIAGNOSIS — I82461 Acute embolism and thrombosis of right calf muscular vein: Secondary | ICD-10-CM

## 2022-09-07 ENCOUNTER — Encounter
Admit: 2022-09-07 | Payer: PRIVATE HEALTH INSURANCE | Attending: Pharmacist Clinician (PhC)/ Clinical Pharmacy Specialist | Primary: Internal Medicine

## 2022-09-07 ENCOUNTER — Ambulatory Visit
Admit: 2022-09-07 | Payer: BLUE CROSS/BLUE SHIELD | Attending: Pharmacist Clinician (PhC)/ Clinical Pharmacy Specialist | Primary: Internal Medicine

## 2022-09-07 DIAGNOSIS — I82461 Acute embolism and thrombosis of right calf muscular vein: Secondary | ICD-10-CM

## 2022-09-07 DIAGNOSIS — E119 Type 2 diabetes mellitus without complications: Secondary | ICD-10-CM

## 2022-09-07 DIAGNOSIS — I82409 Acute embolism and thrombosis of unspecified deep veins of unspecified lower extremity: Secondary | ICD-10-CM

## 2022-09-07 DIAGNOSIS — I10 Essential (primary) hypertension: Secondary | ICD-10-CM

## 2022-09-07 NOTE — Progress Notes
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  He presents to the clinic today, 09/07/2022, for routine INR monitoring. Patient's INR findings and prescribed dose from last visit:  08/01/2022  10:00 AM 08/31/2022   9:50 AM 09/07/2022   9:20 AM Antico[facility] Monitoring [facility]oc[facility] INR Date 08/01/2022 08/31/2022 09/07/2022 Associated INR 2.7 1.7 1.3 Instructions 7.5 mg every day 10/12: 11.25 mg; Otherwise 7.5 mg every day 10/19: 11.25 mg; 10/20: 11.25 mg; 10/21: 11.25 mg; Otherwise 7.5 mg every day Sunday Dose 7.5 mg 7.5 mg 7.5 mg Monday Dose 7.5 mg 7.5 mg - Tuesday Dose 7.5 mg 7.5 mg - Wednesday Dose 7.5 mg 7.5 mg 7.5 mg Thursday Dose 7.5 mg 11.25 mg (10/12) 11.25 mg (10/19) Friday Dose 7.5 mg 7.5 mg 11.25 mg (10/20) Saturday Dose 7.5 mg 7.5 mg 11.25 mg (10/21) Last 7 day dose total 0 mg 52.5 mg 56.25 mg Next INR Date 08/31/2022 09/07/2022 09/12/2022 The following findings were reported from today's visit (no findings if none selected):[]  Missed doses []  Extra doses []  Change in medications []  Change in vitamin k rich food intake (green vegetables, herbals, etc) []  Change in alcohol use []  Change in overall health []  Recent hospitalization / emergency department visit []  Upcoming procedure (including dental procedures) [x]  Other Comments:       Increase in activity  The following side effects were reported from today's visit (no side effects if none selected):[]  Bleeding []  New bruising []  Warfarin-emergency department visit or hospital admission []  Other Comments:   No side effects Assessment/Plan:Patient's INR is 1.3, which is below goal range of 2.0-3.0.  Patient confirms increasing warfarin dose last week as instructed.  Usual greens.  Denies missed warfarin doses.  Tablets look the same.  Does not take a MVI.  No v8 juice, green drinks or other hidden sources of vitamin K.  Alcohol at baseline.  Reports increase in activity- including significant yardwork and work on the outside of his home.  Increase in activity may decrease INR.  He was instructed to increase weekly dose by 13% as indicated in the maintenance plan below.  Next INR assessment due Monday. This information was routed to the covering provider for review.  Patient counseled to avoid greens through the weekend and seek immediate medical attention for any s/sx of a blood clot.  Patient already took warfarin this morning so will begin increased dose tomorrow.Anticoagulation Summary  As of 09/07/2022  INR goal:  2.0-3.0 TTR:  48.4 % (1 mo) INR used for dosing:  1.3 (09/07/2022) Warfarin maintenance plan:  7.5 mg (7.5 mg x 1) every day Weekly warfarin total:  52.5 mg Plan last modified:  [name], PharmD (08/01/2022) Next INR check:  09/12/2022 Priority:  Maintenance Target end date:  Indefinite  Indications  Deep vein thrombosis (DVT) of calf muscle vein of right lower extremityunspecified chronicity (HC Code) [I82.461]   Anticoagulation Episode Summary   INR check location:  POCT (Point of Care Test)  Preferred lab:  [facility]  Send INR reminders to:  [facility] OUTPT INR POOL  Comments:    Anticoagulation Care Providers   Provider Role Specialty Phone number  Lenetta Quaker, DO Referring Internal Medicine [phone]  Raelyn Mora, MD Responsible Cardiovascular Dis [phone]  All patient's medications  have been reviewed and updated as needed.  I spent 15 min on direct patient education and consulting.The following education was provided for reeducation for out of range [id number] instructions, Administration considerations (e.g., missed dose management, consistent dosing time), Indication for warfarin, Blood tests and importance of follow-up monitoring, Signs/symptoms of clot or stroke, Dietary considerations while taking warfarin and Safety considerations while taking warfarin.Electronically Signed by [name], PharmD, October 18, 2023[facility]  [address]  [place]  Phone: [phone]  Fax: [phone]

## 2022-09-12 ENCOUNTER — Ambulatory Visit
Admit: 2022-09-12 | Payer: BLUE CROSS/BLUE SHIELD | Attending: Pharmacist Clinician (PhC)/ Clinical Pharmacy Specialist | Primary: Internal Medicine

## 2022-09-12 ENCOUNTER — Encounter
Admit: 2022-09-12 | Payer: PRIVATE HEALTH INSURANCE | Attending: Pharmacist Clinician (PhC)/ Clinical Pharmacy Specialist | Primary: Internal Medicine

## 2022-09-12 DIAGNOSIS — I82461 Acute embolism and thrombosis of right calf muscular vein: Secondary | ICD-10-CM

## 2022-09-12 DIAGNOSIS — I82409 Acute embolism and thrombosis of unspecified deep veins of unspecified lower extremity: Secondary | ICD-10-CM

## 2022-09-12 DIAGNOSIS — I10 Essential (primary) hypertension: Secondary | ICD-10-CM

## 2022-09-12 DIAGNOSIS — E119 Type 2 diabetes mellitus without complications: Secondary | ICD-10-CM

## 2022-09-12 NOTE — Progress Notes
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  He presents to the clinic today, 09/12/2022, for routine INR monitoring. Patient's INR findings and prescribed dose from last visit:  08/31/2022   9:50 AM 09/07/2022   9:20 AM 09/12/2022   9:30 AM [facility]tico[facility] Monitoring [facility]oc[facility] INR Date 08/31/2022 09/07/2022 09/12/2022 Associated INR 1.7 1.3 2.6 Instructions 10/12: 11.25 mg; Otherwise 7.5 mg every day 10/19: 11.25 mg; 10/20: 11.25 mg; 10/21: 11.25 mg; Otherwise 7.5 mg every day 11.25 mg every Sun, Thu; 7.5 mg all other days Sunday Dose 7.5 mg 7.5 mg 11.25 mg Monday Dose 7.5 mg - 7.5 mg Tuesday Dose 7.5 mg - 7.5 mg Wednesday Dose 7.5 mg 7.5 mg 7.5 mg Thursday Dose 11.25 mg (10/12) 11.25 mg (10/19) 11.25 mg Friday Dose 7.5 mg 11.25 mg (10/20) 7.5 mg Saturday Dose 7.5 mg 11.25 mg (10/21) 7.5 mg Last 7 day dose total 52.5 mg 56.25 mg 63.75 mg Next INR Date 09/07/2022 09/12/2022 09/19/2022 The following findings were reported from today's visit (no findings if none selected):[]  Missed doses []  Extra doses []  Change in medications [x]  Change in vitamin k rich food intake (green vegetables, herbals, etc) []  Change in alcohol use []  Change in overall health []  Recent hospitalization / emergency department visit []  Upcoming procedure (including dental procedures) []  Other Comments:       Avoided greens as instructed The following side effects were reported from today's visit (no side effects if none selected):[]  Bleeding []  New bruising []  Warfarin-emergency department visit or hospital admission []  Other Comments:   No side effects Assessment/Plan:Patient's INR is 2.6, which is within goal range of 2.0-3.0, increased from 1.3 five days ago.  Patient confirms avoiding vitamin K, other than a small serving of guacamole last night, due to low INR.  Suspected INR has was low due to significant increase in activity, which may decrease INR.  Denies missed warfarin doses.  INR was 1.3 on warfarin 56.25 mg/week (previously stable dose 52.5 mg/week) but has now accumulated rapidly on warfarin 63.75 mg/week.  Will try warfarin 60 mg/week and patient will resume his usual greens starting today.  He was instructed to take as indicated in the maintenance plan below.  Next INR assessment due 1 week. This information was routed to the covering provider for review.  Consult to Dr. Raelyn Mora, MD, clinic medical director, for management of unstable INR.Anticoagulation Summary  As of 09/12/2022  INR goal:  2.0-3.0 TTR:  48.1 % (1.2 mo) INR used for dosing:  2.6 (09/12/2022) Warfarin maintenance plan:  11.25 mg (7.5 mg x 1.5) every Sun, Thu; 7.5 mg (7.5 mg x 1) all other days Weekly warfarin total:  60 mg Plan last modified:  Andrew [name], PharmD (09/12/2022) Next INR check:  09/19/2022 Priority:  Maintenance Target end date:  Indefinite  Indications  Deep vein thrombosis (DVT) of calf muscle vein of right lower extremityunspecified chronicity (HC Code) [I82.461]   Anticoagulation Episode Summary   INR check location:  POCT (Point of Care Test)  Preferred lab:  [facility]  Send INR reminders to:  [facility] OUTPT INR POOL  Comments:    Anticoagulation Care Providers   Provider Role Specialty Phone number  Lenetta Quaker, DO Referring Internal Medicine [phone]  Raelyn Mora,  MD Responsible Cardiovascular Dis [phone]  All patient's medications have been reviewed and updated as needed.  Electronically Signed by Myshawn Chiriboga E Andrew HugerrmD, October 23, 2023[facility] [address]  Phone: [phone]  Fax: [phone]

## 2022-09-19 ENCOUNTER — Encounter: Admit: 2022-09-19 | Payer: PRIVATE HEALTH INSURANCE | Primary: Internal Medicine

## 2022-09-19 ENCOUNTER — Ambulatory Visit: Admit: 2022-09-19 | Payer: BLUE CROSS/BLUE SHIELD | Primary: Internal Medicine

## 2022-09-19 DIAGNOSIS — E119 Type 2 diabetes mellitus without complications: Secondary | ICD-10-CM

## 2022-09-19 DIAGNOSIS — I82409 Acute embolism and thrombosis of unspecified deep veins of unspecified lower extremity: Secondary | ICD-10-CM

## 2022-09-19 DIAGNOSIS — I10 Essential (primary) hypertension: Secondary | ICD-10-CM

## 2022-09-19 DIAGNOSIS — I82461 Acute embolism and thrombosis of right calf muscular vein: Secondary | ICD-10-CM

## 2022-09-19 NOTE — Progress Notes
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  He presents to the clinic today, 09/19/2022, for routine INR monitoring. Patient's INR findings and prescribed dose from last visit:  09/07/2022   9:20 AM 09/12/2022   9:30 AM 09/19/2022   9:30 AM Anticoagulation Monitoring Assoc[facility] INR Date 09/07/2022 09/12/2022 09/19/2022 Associated INR 1.3 2.6 2.3 Instructions 10/19: 11.25 mg; 10/20: 11.25 mg; 10/21: 11.25 mg; Otherwise 7.5 mg every day 11.25 mg every Sun, Thu; 7.5 mg all other days 11.25 mg every Sun, Thu; 7.5 mg all other days Sunday Dose 7.5 mg 11.25 mg 11.25 mg Monday Dose - 7.5 mg 7.5 mg Tuesday Dose - 7.5 mg 7.5 mg Wednesday Dose 7.5 mg 7.5 mg 7.5 mg Thursday Dose 11.25 mg (10/19) 11.25 mg 11.25 mg Friday Dose 11.25 mg (10/20) 7.5 mg 7.5 mg Saturday Dose 11.25 mg (10/21) 7.5 mg 7.5 mg Last 7 day dose total 56.25 mg 63.75 mg 60 mg Next INR Date 09/12/2022 09/19/2022 10/03/2022 The following findings were reported from today's visit (no findings if none selected):[]  Missed doses []  Extra doses []  Change in medications []  Change in vitamin k rich food intake (green vegetables, herbals, etc) []  Change in alcohol use []  Change in overall health []  Recent hospitalization / emergency department visit []  Upcoming procedure (including dental procedures) []  Other Comments:       No findings The following side effects were reported from today's visit (no side effects if none selected):[]  Bleeding []  New bruising []  Warfarin-emergency department visit or hospital admission []  Other Comments:   No side effects Assessment/Plan:Patient's INR is 2.3, which is within goal range of 2.0-3.0. Patient reports taking warfarin as outlined on the tracker. He did resume greens this past weeks, and suspects he had greens three times.  He was instructed to continue current weekly dose as indicated in the maintenance plan below.  Next INR assessment due 2 weeks. This information was routed to the covering provider for review.Anticoagulation Summary  As of 09/19/2022  INR goal:  2.0-3.0 TTR:  56.5 % (1.4 mo) INR used for dosing:  2.3 (09/19/2022) Warfarin maintenance plan:  11.25 mg (7.5 mg x 1.5) every Sun, Thu; 7.5 mg (7.5 mg x 1) all other days Weekly warfarin total:  60 mg Plan last modified:  [name], PharmD (09/12/2022) Next INR check:  10/03/2022 Priority:  Maintenance Target end date:  Indefinite  Indications  Deep vein thrombosis (DVT) of calf muscle vein of right lower extremityunspecified chronicity (HC Code) [I82.461]   Anticoagulation Episode Summary   INR check location:  POCT (Point of Care Test)  Preferred lab:  -[facility]  Send INR reminders to:  [facility] OUTPT INR POOL  Comments:    Anticoagulation Care Providers   Provider Role Specialty Phone number  Lenetta Quaker, DO Referring Internal Medicine [phone]  Raelyn Mora, MD Responsible Cardiovascular Dis [phone]  All patient's medications have been reviewed and updated as needed.  Electronically Signed by Vallery Ridge, PharmD, October 30, 2023[facility]  [address]  [place]  Phone: [phone]  Fax: [phone]

## 2022-10-03 ENCOUNTER — Ambulatory Visit
Admit: 2022-10-03 | Payer: BLUE CROSS/BLUE SHIELD | Attending: Pharmacist Clinician (PhC)/ Clinical Pharmacy Specialist | Primary: Internal Medicine

## 2022-10-03 ENCOUNTER — Encounter
Admit: 2022-10-03 | Payer: PRIVATE HEALTH INSURANCE | Attending: Pharmacist Clinician (PhC)/ Clinical Pharmacy Specialist | Primary: Internal Medicine

## 2022-10-03 DIAGNOSIS — I82409 Acute embolism and thrombosis of unspecified deep veins of unspecified lower extremity: Secondary | ICD-10-CM

## 2022-10-03 DIAGNOSIS — E119 Type 2 diabetes mellitus without complications: Secondary | ICD-10-CM

## 2022-10-03 DIAGNOSIS — I10 Essential (primary) hypertension: Secondary | ICD-10-CM

## 2022-10-03 DIAGNOSIS — I82461 Acute embolism and thrombosis of right calf muscular vein: Secondary | ICD-10-CM

## 2022-10-03 NOTE — Progress Notes
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  He presents to the clinic today, 10/03/2022, for routine INR monitoring. Patient's INR findings and prescribed dose from last visit:  09/12/2022   9:30 AM 09/19/2022   9:30 AM 10/03/2022   9:30 AM Anticoagulation Monitoring Assoc[facility] INR Date 09/12/2022 09/19/2022 10/03/2022 Associated INR 2.6 2.3 2.1 Instructions 11.25 mg every Sun, Thu; 7.5 mg all other days 11.25 mg every Sun, Thu; 7.5 mg all other days 11.25 mg every Sun, Thu; 7.5 mg all other days Sunday Dose 11.25 mg 11.25 mg 11.25 mg Monday Dose 7.5 mg 7.5 mg 7.5 mg Tuesday Dose 7.5 mg 7.5 mg 7.5 mg Wednesday Dose 7.5 mg 7.5 mg 7.5 mg Thursday Dose 11.25 mg 11.25 mg 11.25 mg Friday Dose 7.5 mg 7.5 mg 7.5 mg Saturday Dose 7.5 mg 7.5 mg 7.5 mg Last 7 day dose total 63.75 mg 60 mg 60 mg Next INR Date 09/19/2022 10/03/2022 10/31/2022 The following findings were reported from today's visit (no findings if none selected):[x]  Missed doses []  Extra doses []  Change in medications []  Change in vitamin k rich food intake (green vegetables, herbals, etc) []  Change in alcohol use []  Change in overall health []  Recent hospitalization / emergency department visit []  Upcoming procedure (including dental procedures) []  Other Comments:       Missed dose 11/4 The following side effects were reported from today's visit (no side effects if none selected):[]  Bleeding []  New bruising []  Warfarin-emergency department visit or hospital admission []  Other Comments:   No side effects Assessment/Plan:Patient's INR is 2.1, which is within goal range of 2.0-3.0.  Patient continues with high activity level which required a dose increase several weeks ago.  Patient reports he missed his warfarin 7.5 mg dose last Saturday 11/4 for a 12.5% dose reduction.  Do not expect missed dose >1 week ago reflected in today's INR.  He was instructed to continue current weekly dose as indicated in the maintenance plan below.  Next INR assessment due 4 weeks. This information was routed to the covering provider for review.Anticoagulation Summary  As of 10/03/2022  INR goal:  2.0-3.0 TTR:  67.2 % (1.9 mo) INR used for dosing:  2.1 (10/03/2022) Warfarin maintenance plan:  11.25 mg (7.5 mg x 1.5) every Sun, Thu; 7.5 mg (7.5 mg x 1) all other days Weekly warfarin total:  60 mg Plan last modified:  Andrew [name], PharmD (09/12/2022) Next INR check:  10/31/2022 Priority:  Maintenance Target end date:  Indefinite  Indications  Deep vein thrombosis (DVT) of calf muscle vein of right lower extremityunspecified chronicity (HC Code) [I82.461]   Anticoagulation Episode Summary   INR check location:  POCT (Point of Care Test)  Preferred lab:  [facility]  Send INR reminders to:  [facility] OUTPT INR POOL  Comments:    Anticoagulation Care Providers   Provider Role Specialty Phone number  Andrew Quaker, DO Referring Internal Medicine [phone]  Andrew Mora, MD Responsible Cardiovascular Dis [phone]  All patient's medications have been reviewed and updated as needed.  Electronically Signed by Andrew [name], PharmD, November 13, 2023[facility]  [address]  [place]  Phone: [phone]  Fax: [phone]

## 2022-10-31 ENCOUNTER — Ambulatory Visit: Admit: 2022-10-31 | Payer: MEDICARE | Primary: Internal Medicine

## 2022-10-31 DIAGNOSIS — I82461 Acute embolism and thrombosis of right calf muscular vein: Secondary | ICD-10-CM

## 2022-10-31 NOTE — Progress Notes
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  He presents to the clinic today, 10/31/2022, for routine INR monitoring. Patient's INR findings and prescribed dose from last visit:  09/19/2022   9:30 AM 10/03/2022   9:30 AM 10/31/2022   9:30 AM Anticoagulation Monitoring Assoc. INR Date 09/19/2022 10/03/2022 10/31/2022 Associated INR 2.3 2.1 2.2 Instructions 11.25 mg every Sun, Thu; 7.5 mg all other days 11.25 mg every Sun, Thu; 7.5 mg all other days 11.25 mg every Sun, Thu; 7.5 mg all other days Sunday Dose 11.25 mg 11.25 mg 11.25 mg Monday Dose 7.5 mg 7.5 mg 7.5 mg Tuesday Dose 7.5 mg 7.5 mg 7.5 mg Wednesday Dose 7.5 mg 7.5 mg 7.5 mg Thursday Dose 11.25 mg 11.25 mg 11.25 mg Friday Dose 7.5 mg 7.5 mg 7.5 mg Saturday Dose 7.5 mg 7.5 mg 7.5 mg Last 7 day dose total 60 mg 60 mg 60 mg Next INR Date 10/03/2022 10/31/2022 11/28/2022 The following findings were reported from today's visit (no findings if none selected):[]  Missed doses []  Extra doses []  Change in medications []  Change in vitamin k rich food intake (green vegetables, herbals, etc) []  Change in alcohol use []  Change in overall health []  Recent hospitalization / emergency department visit []  Upcoming procedure (including dental procedures) []  Other Comments:       No findings The following side effects were reported from today's visit (no side effects if none selected):[]  Bleeding []  New bruising []  Warfarin-emergency department visit or hospital admission []  Other Comments:   No side effects Assessment/Plan:Patient's INR is 2.2, which is within goal range of 2.0-3.0.  INR remains stable.  He was instructed to continue current weekly dose as indicated in the maintenance plan below.  Next INR assessment due 4 weeks. This information was routed to the covering provider for review.Anticoagulation Summary  As of 10/31/2022  INR goal:  2.0-3.0 TTR:  78.0 % (2.8 mo) INR used for dosing:  2.2 (10/31/2022) Warfarin maintenance plan:  11.25 mg (7.5 mg x 1.5) every Sun, Thu; 7.5 mg (7.5 mg x 1) all other days Weekly warfarin total:  60 mg Plan last modified:  [name], PharmD (09/12/2022) Next INR check:  11/28/2022 Priority:  Maintenance Target end date:  Indefinite  Indications  Deep vein thrombosis (DVT) of calf muscle vein of right lower extremityunspecified chronicity (HC Code) [I82.461]   Anticoagulation Episode Summary   INR check location:  POCT (Point of Care Test)  Preferred lab:  [facility]  Send INR reminders to:  [facility] OUTPT INR POOL  Comments:    Anticoagulation Care Providers   Provider Role Specialty Phone number  Lenetta Quaker, DO Referring Internal Medicine [phone]  Raelyn Mora, MD Responsible Cardiovascular Dis [phone]  All patient's medications have been reviewed and updated as needed.  Electronically Signed by Vallery Ridge, PharmD, December 11, 2023[facility]  [address]  [place]  Phone: [phone]  Fax: [phone]

## 2022-11-28 ENCOUNTER — Ambulatory Visit
Admit: 2022-11-28 | Payer: BLUE CROSS/BLUE SHIELD | Attending: Pharmacist Clinician (PhC)/ Clinical Pharmacy Specialist | Primary: Internal Medicine

## 2022-11-28 ENCOUNTER — Encounter
Admit: 2022-11-28 | Payer: PRIVATE HEALTH INSURANCE | Attending: Pharmacist Clinician (PhC)/ Clinical Pharmacy Specialist | Primary: Internal Medicine

## 2022-11-28 DIAGNOSIS — I10 Essential (primary) hypertension: Secondary | ICD-10-CM

## 2022-11-28 DIAGNOSIS — I82461 Acute embolism and thrombosis of right calf muscular vein: Secondary | ICD-10-CM

## 2022-11-28 DIAGNOSIS — E119 Type 2 diabetes mellitus without complications: Secondary | ICD-10-CM

## 2022-11-28 DIAGNOSIS — I82409 Acute embolism and thrombosis of unspecified deep veins of unspecified lower extremity: Secondary | ICD-10-CM

## 2022-11-28 NOTE — Progress Notes
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  He presents to the clinic today, 11/28/2022, for routine INR monitoring. Patient's INR findings and prescribed dose from last visit:  10/03/2022   9:30 AM 10/31/2022   9:30 AM 11/28/2022   9:30 AM [facility] Monitoring [facility]oc[facility] INR Date 10/03/2022 10/31/2022 11/28/2022 Associated INR 2.1 2.2 1.6 Instructions 11.25 mg every Sun, Thu; 7.5 mg all other days 11.25 mg every Sun, Thu; 7.5 mg all other days 1/9: 11.25 mg; 1/10: 11.25 mg; 1/11: 7.5 mg; 1/12: 11.25 mg; Otherwise 11.25 mg every Sun, Thu; 7.5 mg all other days Sunday Dose 11.25 mg 11.25 mg 11.25 mg Monday Dose 7.5 mg 7.5 mg 7.5 mg Tuesday Dose 7.5 mg 7.5 mg 11.25 mg (1/9) Wednesday Dose 7.5 mg 7.5 mg 11.25 mg (1/10) Thursday Dose 11.25 mg 11.25 mg 7.5 mg (1/11) Friday Dose 7.5 mg 7.5 mg 11.25 mg (1/12) Saturday Dose 7.5 mg 7.5 mg 7.5 mg Last 7 day dose total 60 mg 60 mg 60 mg Next INR Date 10/31/2022 11/28/2022 12/06/2022 The following findings were reported from today's visit (no findings if none selected):[]  Missed doses []  Extra doses []  Change in medications []  Change in vitamin k rich food intake (green vegetables, herbals, etc) []  Change in alcohol use []  Change in overall health []  Recent hospitalization / emergency department visit []  Upcoming procedure (including dental procedures) [x]  Other Comments:       Increase in activity The following side effects were reported from today's visit (no side effects if none selected):[]  Bleeding []  New bruising []  Warfarin-emergency department visit or hospital admission []  Other Comments:   No side effects Assessment/Plan:Patient's INR is 1.6, which is below goal range of 2.0-3.0.  Patient reports he missed his Saturday warfarin dose two weeks ago for a 12% dose decrease, would not expect this to be reflected in today's INR.  Patient confirms no missed doses in the past 7-10 days.  Reports increased activity- may decrease INR.  No change in vitamin K intake- only has green beans and salads- no high vitamin K containing foods.  Denies hidden sources of vitamin K.  No MVI.  No change in alcohol intake.  Warfarin tablets looks the same.  Already took warfarin 7.5 mg this morning.  Suspect INR trending down again due to activity level. He was instructed to increase weekly dose by 12% as indicated in the maintenance plan below.  Next INR assessment due 1 week. This information was routed to the covering provider for review.    Patient counseled to avoid greens x 3 days and seek medical attention for any s/sx of a blood clot.Anticoagulation Summary  As of 11/28/2022  INR goal:  2.0-3.0 TTR:  66.9 % (3.8 mo) INR used for dosing:  1.6 (11/28/2022) Warfarin maintenance plan:  11.25 mg (7.5 mg x 1.5) every Sun, Thu; 7.5 mg (7.5 mg x 1) all other days Weekly warfarin total:  60 mg Plan last modified:  [name], PharmD (09/12/2022) Next INR check:  12/06/2022 Priority:  Maintenance Target end date:  Indefinite  Indications  Deep vein thrombosis (DVT) of calf muscle vein of right lower extremityunspecified chronicity (HC Code) [I82.461]   Anticoagulation Episode Summary   INR check location:  POCT [facility] Care Test)  Preferred lab:  [facility]  Send INR reminders to:  [facility] PHARMAC[facility] OUTPT INR POOL  Comments:    Anticoagulation Care Providers   Provider Role Specialty Phone number  Lenetta Quaker, DO Referring Internal Medicine [phone]  Raelyn Mora, MD Responsible Cardiovascular Dis [phone]  All patient's medications have been reviewed and updated as needed. I spent 15 min on direct patient education and consulting.The following education was provided for reeducation for out of range XBM:WUXLKG instructions, Administration considerations (e.g., missed dose management, consistent dosing time), Indication for warfarin, Blood tests and importance of follow-up monitoring, Interacting medications, Signs/symptoms of clot or stroke, Dietary considerations while taking warfarin and Safety considerations while taking warfarin.Electronically Signed by [name], PharmD, January 8, 2024[facility]  [address]  [place]  Phone: [phone]  Fax: [phone]

## 2022-12-06 ENCOUNTER — Encounter: Admit: 2022-12-06 | Payer: PRIVATE HEALTH INSURANCE | Attending: Pharmacist | Primary: Internal Medicine

## 2022-12-06 ENCOUNTER — Ambulatory Visit: Admit: 2022-12-06 | Payer: BLUE CROSS/BLUE SHIELD | Attending: Pharmacist | Primary: Internal Medicine

## 2022-12-06 DIAGNOSIS — I10 Essential (primary) hypertension: Secondary | ICD-10-CM

## 2022-12-06 DIAGNOSIS — I82409 Acute embolism and thrombosis of unspecified deep veins of unspecified lower extremity: Secondary | ICD-10-CM

## 2022-12-06 DIAGNOSIS — E119 Type 2 diabetes mellitus without complications: Secondary | ICD-10-CM

## 2022-12-06 NOTE — Progress Notes
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  He presents to the clinic today, 12/06/2022, for routine INR monitoring. Patient's INR findings and prescribed dose from last visit:  10/31/2022   9:30 AM 11/28/2022   9:30 AM 12/06/2022   9:00 AM Anticoagulation Monitoring Assoc. INR Date 10/31/2022 11/28/2022 12/06/2022 Associated INR 2.2 1.6 2.8 Instructions 11.25 mg every Sun, Thu; 7.5 mg all other days 1/9: 11.25 mg; 1/10: 11.25 mg; 1/11: 7.5 mg; 1/12: 11.25 mg; Otherwise 11.25 mg every Sun, Thu; 7.5 mg all other days 11.25 mg every Sun, Wed, Fri; 7.5 mg all other days Sunday Dose 11.25 mg 11.25 mg 11.25 mg Monday Dose 7.5 mg 7.5 mg - Tuesday Dose 7.5 mg 11.25 mg (1/9) 7.5 mg Wednesday Dose 7.5 mg 11.25 mg (1/10) 11.25 mg Thursday Dose 11.25 mg 7.5 mg (1/11) 7.5 mg Friday Dose 7.5 mg 11.25 mg (1/12) 11.25 mg Saturday Dose 7.5 mg 7.5 mg 7.5 mg Last 7 day dose total 60 mg 60 mg 67.5 mg Next INR Date 11/28/2022 12/06/2022 12/12/2022 The following findings were reported from today's visit (no findings if none selected):[]  Missed doses []  Extra doses []  Change in medications []  Change in vitamin k rich food intake (green vegetables, herbals, etc) []  Change in alcohol use []  Change in overall health []  Recent hospitalization / emergency department visit []  Upcoming procedure (including dental procedures) []  Other Comments:       No findings The following side effects were reported from today's visit (no side effects if none selected):[]  Bleeding []  New bruising []  Warfarin-emergency department visit or hospital admission []  Other Comments:   No side effects Assessment/Plan:Patient's INR is 2.8, which is within goal range of 2.0-3.0.  INR increased from 1.6 on 1/8 to 2.8 today after 12% dose increase last week.  Patient reports having an increase in vitamin K intake, that he had a lot of pea soup, prior to subtherapeutic on 1/8 (did not remember to report at last encounter). Patient reports baseline vitamin K intake, which is med-low in vitamin K intake, a decrease from prior week; reports increased intake prior to 1/8 encounter (decrease in vitamin K intake can decrease INR) .  Dose of warfarin taken the past 7 days was 67.5mg . Since patient reports his vitamin K/green intake has decreased to baseline, will try warfarin 63.75mg /wk (about 6% increase from previous stable dose of 60mg /wk). Will rtc in 1 week.   He was instructed to take as indicated in the maintenance plan below.  Next INR assessment due 1 week. This information was routed to the covering provider for review. Consult to Dr. Raelyn Mora, MD, clinic medical director, for management of recently labile INRAnticoagulation Summary  As of 12/06/2022  INR goal:  2.0-3.0 TTR:  66.9 % (4 mo) INR used for dosing:  2.8 (12/06/2022) Warfarin maintenance plan:  11.25 mg (7.5 mg x 1.5) every Sun, Wed, Fri; 7.5 mg (7.5 mg x 1) all other days Weekly warfarin total:  63.75 mg Plan last modified:  [name], [facility] (12/06/2022) Next INR check:  12/12/2022 Priority:  Maintenance Target end date:  Indefinite  Indications  Deep vein thrombosis (DVT) of calf muscle vein of right lower extremityunspecified chronicity (HC Code) [I82.461]   Anticoagulation Episode Summary   INR check location:  POCT (  Point of Care Test)  Preferred lab:  [facility]  Send INR reminders to:  [facility] PHARMAC[facility] OUTPT INR POOL  Comments:    Anticoagulation Care Providers   Provider Role Specialty Phone number  Lenetta Quaker, DO Referring Internal Medicine [phone]  Raelyn Mora, MD Responsible Cardiovascular Dis [phone]  All patient's medications have been reviewed and updated as needed.  I spent 15 min on direct patient education and consulting.The following education was provided for ::Dosing instructions, Blood tests and importance of follow-up monitoring and Dietary considerations while taking warfarin.Electronically Signed by [name], RPH, January 16, 2024[facility]  [address]  [place]  Phone: [phone]  Fax: [phone]

## 2022-12-07 DIAGNOSIS — I82461 Acute embolism and thrombosis of right calf muscular vein: Secondary | ICD-10-CM

## 2022-12-12 ENCOUNTER — Encounter
Admit: 2022-12-12 | Payer: PRIVATE HEALTH INSURANCE | Attending: Pharmacist Clinician (PhC)/ Clinical Pharmacy Specialist | Primary: Internal Medicine

## 2022-12-12 ENCOUNTER — Ambulatory Visit
Admit: 2022-12-12 | Payer: BLUE CROSS/BLUE SHIELD | Attending: Pharmacist Clinician (PhC)/ Clinical Pharmacy Specialist | Primary: Internal Medicine

## 2022-12-12 DIAGNOSIS — I82409 Acute embolism and thrombosis of unspecified deep veins of unspecified lower extremity: Secondary | ICD-10-CM

## 2022-12-12 DIAGNOSIS — I10 Essential (primary) hypertension: Secondary | ICD-10-CM

## 2022-12-12 DIAGNOSIS — E119 Type 2 diabetes mellitus without complications: Secondary | ICD-10-CM

## 2022-12-12 NOTE — Progress Notes
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  He presents to the clinic today, 12/12/2022, for routine INR monitoring. Patient's INR findings and prescribed dose from last visit:  11/28/2022   9:30 AM 12/06/2022   9:00 AM 12/12/2022  10:00 AM Anticoagulation Monitoring Assoc. INR Date 11/28/2022 12/06/2022 12/12/2022 Associated INR 1.6 2.8 2.1 Instructions 1/9: 11.25 mg; 1/10: 11.25 mg; 1/11: 7.5 mg; 1/12: 11.25 mg; Otherwise 11.25 mg every Sun, Thu; 7.5 mg all other days 11.25 mg every Sun, Wed, Fri; 7.5 mg all other days 11.25 mg every Sun, Wed, Fri; 7.5 mg all other days Sunday Dose 11.25 mg 11.25 mg 11.25 mg Monday Dose 7.5 mg - 7.5 mg Tuesday Dose 11.25 mg (1/9) 7.5 mg 7.5 mg Wednesday Dose 11.25 mg (1/10) 11.25 mg 11.25 mg Thursday Dose 7.5 mg (1/11) 7.5 mg 7.5 mg Friday Dose 11.25 mg (1/12) 11.25 mg 11.25 mg Saturday Dose 7.5 mg 7.5 mg 7.5 mg Last 7 day dose total 60 mg 67.5 mg 63.75 mg Next INR Date 12/06/2022 12/12/2022 12/26/2022 The following findings were reported from today's visit (no findings if none selected):[]  Missed doses []  Extra doses []  Change in medications [x]  Change in vitamin k rich food intake (green vegetables, herbals, etc) []  Change in alcohol use []  Change in overall health []  Recent hospitalization / emergency department visit []  Upcoming procedure (including dental procedures) [x]  Other Comments:       Greens at baseline, activity low The following side effects were reported from today's visit (no side effects if none selected):[]  Bleeding []  New bruising []  Warfarin-emergency department visit or hospital admission []  Other Comments:   No side effects Assessment/Plan:Patient's INR is 2.1, which is within goal range of 2.0-3.0.  Patient reports greens as baseline and activity level lower (may increase INR).  No other issues/changes.  He was instructed to continue current weekly dose as indicated in the maintenance plan below.  Next INR assessment due 2 weeks. This information was routed to the covering provider for review.Anticoagulation Summary  As of 12/12/2022  INR goal:  2.0-3.0 TTR:  68.5 % (4.2 mo) INR used for dosing:  2.1 (12/12/2022) Warfarin maintenance plan:  11.25 mg (7.5 mg x 1.5) every Sun, Wed, Fri; 7.5 mg (7.5 mg x 1) all other days Weekly warfarin total:  63.75 mg Plan last modified:  [name], [facility] (12/06/2022) Next INR check:  12/26/2022 Priority:  Maintenance Target end date:  Indefinite  Indications  Deep vein thrombosis (DVT) of calf muscle vein of right lower extremityunspecified chronicity (HC Code) [I82.461]   Anticoagulation Episode Summary   INR check location:  POCT (Point of Care Test)  Preferred lab:  [facility]  Send INR reminders to:  [facility] OUTPT INR POOL  Comments:    Anticoagulation Care Providers   Provider Role Specialty Phone number  Lenetta Quaker, DO Referring Internal Medicine [phone]  Raelyn Mora, MD Responsible Cardiovascular Dis [phone]  All patient's medications have been reviewed and updated as needed.  Electronically Signed by [name], PharmD, January 22, 2024[facility]  [address]  [place]  Phone: [phone]  Fax: [phone]

## 2022-12-13 DIAGNOSIS — I82461 Acute embolism and thrombosis of right calf muscular vein: Secondary | ICD-10-CM

## 2022-12-26 ENCOUNTER — Encounter: Admit: 2022-12-26 | Payer: PRIVATE HEALTH INSURANCE | Primary: Internal Medicine

## 2022-12-26 ENCOUNTER — Ambulatory Visit: Admit: 2022-12-26 | Payer: BLUE CROSS/BLUE SHIELD | Primary: Internal Medicine

## 2022-12-26 DIAGNOSIS — I10 Essential (primary) hypertension: Secondary | ICD-10-CM

## 2022-12-26 DIAGNOSIS — I82461 Acute embolism and thrombosis of right calf muscular vein: Secondary | ICD-10-CM

## 2022-12-26 DIAGNOSIS — E119 Type 2 diabetes mellitus without complications: Secondary | ICD-10-CM

## 2022-12-26 DIAGNOSIS — I82409 Acute embolism and thrombosis of unspecified deep veins of unspecified lower extremity: Secondary | ICD-10-CM

## 2022-12-26 NOTE — Progress Notes
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  He presents to the clinic today, 12/26/2022, for routine INR monitoring. Patient's INR findings and prescribed dose from last visit:  12/06/2022   9:00 AM 12/12/2022  10:00 AM 12/26/2022  10:00 AM Anticoagulation Monitoring Assoc. INR Date 12/06/2022 12/12/2022 12/26/2022 Associated INR 2.8 2.1 2.2 Instructions 11.25 mg every Sun, Wed, Fri; 7.5 mg all other days 11.25 mg every Sun, Wed, Fri; 7.5 mg all other days 11.25 mg every Sun, Wed, Fri; 7.5 mg all other days Sunday Dose 11.25 mg 11.25 mg 11.25 mg Monday Dose - 7.5 mg 7.5 mg Tuesday Dose 7.5 mg 7.5 mg 7.5 mg Wednesday Dose 11.25 mg 11.25 mg 11.25 mg Thursday Dose 7.5 mg 7.5 mg 7.5 mg Friday Dose 11.25 mg 11.25 mg 11.25 mg Saturday Dose 7.5 mg 7.5 mg 7.5 mg Last 7 day dose total 67.5 mg 63.75 mg 63.75 mg Next INR Date 12/12/2022 12/26/2022 01/23/2023 The following findings were reported from today's visit (no findings if none selected):[]  Missed doses []  Extra doses []  Change in medications []  Change in vitamin k rich food intake (green vegetables, herbals, etc) []  Change in alcohol use []  Change in overall health []  Recent hospitalization / emergency department visit []  Upcoming procedure (including dental procedures) []  Other Comments:       No findings The following side effects were reported from today's visit (no side effects if none selected):[]  Bleeding []  New bruising []  Warfarin-emergency department visit or hospital admission []  Other Comments:   No side effects Assessment/Plan:Patient's INR is 2.2, which is within goal range of 2.0-3.0.  Andrew [name] presents to the Anticoagulation Clinic today reporting that nothing that affects the INR (medications, diet, ETOH, activity level, health) has changed - everything has been stable and consistent (no s/s of bleed/clot, but will seek medical attention for any that may arise).  He was instructed to continue current weekly dose as indicated in the maintenance plan below.  Next INR assessment due 4 weeks. This information was routed to the covering provider for review.Anticoagulation Summary  As of 12/26/2022  INR goal:  2.0-3.0 TTR:  71.7 % (4.7 mo) INR used for dosing:  2.2 (12/26/2022) Warfarin maintenance plan:  11.25 mg (7.5 mg x 1.5) every Sun, Wed, Fri; 7.5 mg (7.5 mg x 1) all other days Weekly warfarin total:  63.75 mg Plan last modified:  [name], [facility] (12/06/2022) Next INR check:  01/23/2023 Priority:  Maintenance Target end date:  Indefinite  Indications  Deep vein thrombosis (DVT) of calf muscle vein of right lower extremityunspecified chronicity (HC Code) [I82.461]   Anticoagulation Episode Summary   INR check location:  POCT (Point of Care Test)  Preferred lab:  [facility]  Send INR reminders to:  [facility] PHARMACY OUTPT INR POOL  Comments:    Anticoagulation Care Providers   Provider Role Specialty Phone number  Lenetta Quaker, DO Referring Internal Medicine [phone]  Raelyn Mora, MD Responsible Cardiovascular Dis [phone]  All patient's medications have been reviewed and updated as needed.  I spent 15 min on direct patient education and consulting.The following education was provided for  maintenance therapy :Dosing instructions, Administration considerations (e.g., missed dose management, consistent dosing time), Blood tests and importance of follow-up monitoring, Signs/symptoms of  bleeding or bruising and management, Signs/symptoms of clot or stroke, Dietary considerations while taking warfarin, and Safety considerations while taking warfarin.Electronically Signed by Yaniv Lage, PharmD, FebruaryDelbert Harnessupervised by [place]well [place], [facility]  [address]  [place]  Phone: [phone]  Fax: [phone]

## 2023-01-18 ENCOUNTER — Encounter: Admit: 2023-01-18 | Payer: PRIVATE HEALTH INSURANCE | Attending: Cardiovascular Dis | Primary: Internal Medicine

## 2023-01-23 ENCOUNTER — Encounter: Admit: 2023-01-23 | Payer: PRIVATE HEALTH INSURANCE | Primary: Internal Medicine

## 2023-01-23 ENCOUNTER — Ambulatory Visit: Admit: 2023-01-23 | Payer: BLUE CROSS/BLUE SHIELD | Primary: Internal Medicine

## 2023-01-23 DIAGNOSIS — I82409 Acute embolism and thrombosis of unspecified deep veins of unspecified lower extremity: Secondary | ICD-10-CM

## 2023-01-23 DIAGNOSIS — E119 Type 2 diabetes mellitus without complications: Secondary | ICD-10-CM

## 2023-01-23 DIAGNOSIS — I82461 Acute embolism and thrombosis of right calf muscular vein: Secondary | ICD-10-CM

## 2023-01-23 DIAGNOSIS — I10 Essential (primary) hypertension: Secondary | ICD-10-CM

## 2023-01-23 NOTE — Progress Notes
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  He presents to the clinic today, 01/23/2023, for routine INR monitoring. Patient's INR findings and prescribed dose from last visit:  12/12/2022  10:00 AM 12/26/2022  10:00 AM 01/23/2023  10:00 AM Anticoagulation Monitoring Assoc. INR Date 12/12/2022 12/26/2022 01/23/2023 Associated INR 2.1 2.2 2.6 Instructions 11.25 mg every Sun, Wed, Fri; 7.5 mg all other days 11.25 mg every Sun, Wed, Fri; 7.5 mg all other days 11.25 mg every Sun, Wed, Fri; 7.5 mg all other days Sunday Dose 11.25 mg 11.25 mg 11.25 mg Monday Dose 7.5 mg 7.5 mg 7.5 mg Tuesday Dose 7.5 mg 7.5 mg 7.5 mg Wednesday Dose 11.25 mg 11.25 mg 11.25 mg Thursday Dose 7.5 mg 7.5 mg 7.5 mg Friday Dose 11.25 mg 11.25 mg 11.25 mg Saturday Dose 7.5 mg 7.5 mg 7.5 mg Last 7 day dose total 63.75 mg 63.75 mg 63.75 mg Next INR Date 12/26/2022 01/23/2023 02/20/2023 The following findings were reported from today's visit (no findings if none selected):[]  Missed doses []  Extra doses []  Change in medications []  Change in vitamin k rich food intake (green vegetables, herbals, etc) []  Change in alcohol use []  Change in overall health []  Recent hospitalization / emergency department visit []  Upcoming procedure (including dental procedures) []  Other Comments:       No findings The following side effects were reported from today's visit (no side effects if none selected):[]  Bleeding []  New bruising []  Warfarin-emergency department visit or hospital admission []  Other Comments:   No side effects Assessment/Plan:Patient's INR is 2.6, which is within goal range of 2.0-3.0.  Andrew [name] presents to the Anticoagulation Clinic today reporting that nothing that affects the INR (medications, diet, ETOH, activity level, health) has changed - everything has been stable and consistent (no s/s of bleed/clot, but will seek medical attention for any that may arise).  He was instructed to continue current weekly dose as indicated in the maintenance plan below.  Next INR assessment due 4 weeks. This information was routed to the covering provider for review.Anticoagulation Summary  As of 01/23/2023  INR goal:  2.0-3.0 TTR:  76.4 % (5.6 mo) INR used for dosing:  2.6 (01/23/2023) Warfarin maintenance plan:  11.25 mg (7.5 mg x 1.5) every Sun, Wed, Fri; 7.5 mg (7.5 mg x 1) all other days Weekly warfarin total:  63.75 mg Plan last modified:  [name], [facility] (12/06/2022) Next INR check:  02/20/2023 Priority:  Maintenance Target end date:  Indefinite  Indications  Deep vein thrombosis (DVT) of calf muscle vein of right lower extremityunspecified chronicity (HC Code) [I82.461]   Anticoagulation Episode Summary   INR check location:  POCT (Point of Care Test)  Preferred lab:  [facility]  Send INR reminders to:  [facility] PHARMAC[facility] OUTPT INR POOL  Comments:    Anticoagulation Care Providers   Provider Role Specialty Phone number  Lenetta Quaker, DO Referring Internal Medicine [phone]  Raelyn Mora, MD Responsible Cardiovascular Dis [phone]  All patient's medications have been reviewed and updated as needed.  I spent 15 min on direct patient education and consulting.The following education was provided for  maintenance therapy :Dosing instructions, Indication for warfarin, Dietary considerations while taking warfarin, and Safety considerations while taking warfarin.Electronically Signed by Delbert Harness,  PharmD, March 4, 2024[facility]  [address]  [place]  Phone: [phone]  Fax: [phone]

## 2023-02-20 ENCOUNTER — Ambulatory Visit
Admit: 2023-02-20 | Payer: BLUE CROSS/BLUE SHIELD | Attending: Pharmacist Clinician (PhC)/ Clinical Pharmacy Specialist | Primary: Internal Medicine

## 2023-02-20 ENCOUNTER — Encounter
Admit: 2023-02-20 | Payer: PRIVATE HEALTH INSURANCE | Attending: Pharmacist Clinician (PhC)/ Clinical Pharmacy Specialist | Primary: Internal Medicine

## 2023-02-20 DIAGNOSIS — E119 Type 2 diabetes mellitus without complications: Secondary | ICD-10-CM

## 2023-02-20 DIAGNOSIS — I10 Essential (primary) hypertension: Secondary | ICD-10-CM

## 2023-02-20 DIAGNOSIS — I82461 Acute embolism and thrombosis of right calf muscular vein: Secondary | ICD-10-CM

## 2023-02-20 DIAGNOSIS — I82409 Acute embolism and thrombosis of unspecified deep veins of unspecified lower extremity: Secondary | ICD-10-CM

## 2023-02-20 MED ORDER — WARFARIN 7.5 MG TABLET
7.5 mg | ORAL_TABLET | 4 refills | Status: AC
Start: 2023-02-20 — End: ?

## 2023-02-20 NOTE — Progress Notes
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  He presents to the clinic today, 02/20/2023, for routine INR monitoring. Patient's INR findings and prescribed dose from last visit:  12/26/2022  10:00 AM 01/23/2023  10:00 AM 02/20/2023  10:30 AM Anticoagulation Monitoring Assoc. INR Date 12/26/2022 01/23/2023 02/20/2023 Associated INR 2.2 2.6 2.4 Instructions 11.25 mg every Sun, Wed, Fri; 7.5 mg all other days 11.25 mg every Sun, Wed, Fri; 7.5 mg all other days 11.25 mg every Sun, Wed, Fri; 7.5 mg all other days Sunday Dose 11.25 mg 11.25 mg 11.25 mg Monday Dose 7.5 mg 7.5 mg 7.5 mg Tuesday Dose 7.5 mg 7.5 mg 7.5 mg Wednesday Dose 11.25 mg 11.25 mg 11.25 mg Thursday Dose 7.5 mg 7.5 mg 7.5 mg Friday Dose 11.25 mg 11.25 mg 11.25 mg Saturday Dose 7.5 mg 7.5 mg 7.5 mg Last 7 day dose total 63.75 mg 63.75 mg 63.75 mg Next INR Date 01/23/2023 02/20/2023 03/20/2023 The following findings were reported from today's visit (no findings if none selected):[]  Missed doses []  Extra doses []  Change in medications []  Change in vitamin k rich food intake (green vegetables, herbals, etc) []  Change in alcohol use []  Change in overall health []  Recent hospitalization / emergency department visit []  Upcoming procedure (including dental procedures) []  Other Comments:       No findings The following side effects were reported from today's visit (no side effects if none selected):[]  Bleeding []  New bruising []  Warfarin-emergency department visit or hospital admission []  Other Comments:   No side effects Assessment/Plan:Patient's INR is 2.4, which is within goal range of 2.0-3.0.  INR stable.  He was instructed to continue current weekly dose as indicated in the maintenance plan below.  Next INR assessment due 4 weeks. This information was routed to the covering provider for review.  Due for 3 month education at next visit.Anticoagulation Summary  As of 02/20/2023  INR goal:  2.0-3.0 TTR:  79.7 % (6.6 mo) INR used for dosing:  2.4 (02/20/2023) Warfarin maintenance plan:  11.25 mg (7.5 mg x 1.5) every Sun, Wed, Fri; 7.5 mg (7.5 mg x 1) all other days Weekly warfarin total:  63.75 mg Plan last modified:  [name], [facility] (12/06/2022) Next INR check:  03/20/2023 Priority:  Maintenance Target end date:  Indefinite  Indications  Deep vein thrombosis (DVT) of calf muscle vein of right lower extremityunspecified chronicity (HC Code) [I82.461]   Anticoagulation Episode Summary   INR check location:  POCT (Point of Care Test)  Preferred lab:  [facility]  Send INR reminders to:  [facility] PHARMACY OUTPT INR POOL  Comments:    Anticoagulation Care Providers   Provider Role Specialty Phone number  Lenetta Quaker, DO Referring Internal Medicine [phone]  Raelyn Mora, MD Responsible Cardiovascular Dis [phone]  All patient's medications have been reviewed and updated as needed.  Electronically Signed by [name], PharmD, April 1, 2024[facility]  [address]  [place]  Phone: [phone]  Fax: [phone]

## 2023-03-20 ENCOUNTER — Encounter
Admit: 2023-03-20 | Payer: PRIVATE HEALTH INSURANCE | Attending: Pharmacist Clinician (PhC)/ Clinical Pharmacy Specialist | Primary: Internal Medicine

## 2023-03-20 ENCOUNTER — Ambulatory Visit
Admit: 2023-03-20 | Payer: BLUE CROSS/BLUE SHIELD | Attending: Pharmacist Clinician (PhC)/ Clinical Pharmacy Specialist | Primary: Internal Medicine

## 2023-03-20 DIAGNOSIS — I10 Essential (primary) hypertension: Secondary | ICD-10-CM

## 2023-03-20 DIAGNOSIS — E119 Type 2 diabetes mellitus without complications: Secondary | ICD-10-CM

## 2023-03-20 DIAGNOSIS — I82461 Acute embolism and thrombosis of right calf muscular vein: Secondary | ICD-10-CM

## 2023-03-20 DIAGNOSIS — I82409 Acute embolism and thrombosis of unspecified deep veins of unspecified lower extremity: Secondary | ICD-10-CM

## 2023-03-20 NOTE — Progress Notes
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  He presents to the clinic today, 03/20/2023, for routine INR monitoring. Patient's INR findings and prescribed dose from last visit:  01/23/2023  10:00 AM 02/20/2023  10:30 AM 03/20/2023  10:00 AM [facility] INR Date 01/23/2023 02/20/2023 03/20/2023 Associated INR 2.6 2.4 2.5 Instructions 11.25 mg every Sun, Wed, Fri; 7.5 mg all other days 11.25 mg every Sun, Wed, Fri; 7.5 mg all other days 11.25 mg every Sun, Wed, Fri; 7.5 mg all other days Sunday Dose 11.25 mg 11.25 mg 11.25 mg Monday Dose 7.5 mg 7.5 mg 7.5 mg Tuesday Dose 7.5 mg 7.5 mg 7.5 mg Wednesday Dose 11.25 mg 11.25 mg 11.25 mg Thursday Dose 7.5 mg 7.5 mg 7.5 mg Friday Dose 11.25 mg 11.25 mg 11.25 mg Saturday Dose 7.5 mg 7.5 mg 7.5 mg Last 7 day dose total 63.75 mg 63.75 mg 63.75 mg Next INR Date 02/20/2023 03/20/2023 04/19/2023 The following findings were reported from today's visit (no findings if none selected):[]  Missed doses []  Extra doses []  Change in medications []  Change in vitamin k rich food intake (green vegetables, herbals, etc) []  Change in alcohol use []  Change in overall health []  Recent hospitalization / emergency department visit []  Upcoming procedure (including dental procedures) [x]  Other Comments:        The following side effects were reported from today's visit (no side effects if none selected):[]  Bleeding []  New bruising []  Warfarin-emergency department visit or hospital admission []  Other Comments:   No side effects Assessment/Plan:Patient's INR is 2.5, which is within goal range of 2.0-3.0.  Patient reports more activity with nicer weather- may decrease INR.  Patient reports there was a small chance he missed his warfarin 7.5 mg dose this past Saturday, but he is fairly certain he did take it.  Would have been a 12% dose decrease-given in range INR, suspect patient took warfarin correctly.  Missed/altered warfarin dose education provided.  No other issues/changes.  He was instructed to continue current weekly dose as indicated in the maintenance plan below.  Next INR assessment due 4 weeks. This information was routed to the covering provider for review.Anticoagulation Summary  As of 03/20/2023  INR goal:  2.0-3.0 TTR:  82.2 % (7.5 mo) INR used for dosing:  2.5 (03/20/2023) Warfarin maintenance plan:  11.25 mg (7.5 mg x 1.5) every Sun, Wed, Fri; 7.5 mg (7.5 mg x 1) all other days Weekly warfarin total:  63.75 mg Plan last modified:  [name], [facility] (12/06/2022) Next INR check:  04/19/2023 Priority:  Maintenance Target end date:  Indefinite  Indications  Deep vein thrombosis (DVT) of calf muscle vein of right lower extremityunspecified chronicity (HC Code) [I82.461]   Anticoagulation Episode Summary   INR check location:  POCT (Point of Care Test)  Preferred lab:  [facility]  Send INR reminders to:  [facility] OUTPT INR POOL  Comments:    Anticoagulation Care Providers   Provider Role Specialty Phone number  Lenetta Quaker, DO Referring Internal Medicine [phone]  Raelyn Mora, MD Responsible Cardiovascular Dis [phone]  All patient's medications have been reviewed and updated as needed.  I spent 15 min on direct patient education and consulting.The following education was provided for 3 month reeducation for a  chronically stable patient:Dosing instructions, Administration considerations (e.g., missed dose management, consistent dosing time), Blood tests and importance of follow-up monitoring, Dietary considerations while taking warfarin, and Safety considerations while taking warfarin.Electronically Signed by [name], PharmD, April 29, 2024[facility]  [address]  [place]  Phone: [phone]  Fax: [phone]

## 2023-04-19 ENCOUNTER — Ambulatory Visit
Admit: 2023-04-19 | Payer: BLUE CROSS/BLUE SHIELD | Attending: Pharmacist Clinician (PhC)/ Clinical Pharmacy Specialist | Primary: Internal Medicine

## 2023-04-19 ENCOUNTER — Encounter: Admit: 2023-04-19 | Payer: PRIVATE HEALTH INSURANCE | Primary: Internal Medicine

## 2023-04-19 DIAGNOSIS — E119 Type 2 diabetes mellitus without complications: Secondary | ICD-10-CM

## 2023-04-19 DIAGNOSIS — I10 Essential (primary) hypertension: Secondary | ICD-10-CM

## 2023-04-19 DIAGNOSIS — I82461 Acute embolism and thrombosis of right calf muscular vein: Secondary | ICD-10-CM

## 2023-04-19 DIAGNOSIS — I82409 Acute embolism and thrombosis of unspecified deep veins of unspecified lower extremity: Secondary | ICD-10-CM

## 2023-04-19 NOTE — Progress Notes
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  He presents to the clinic today, 04/19/2023, for routine INR monitoring. Patient's INR findings and prescribed dose from last visit:  02/20/2023  10:30 AM 03/20/2023  10:00 AM 04/19/2023   9:50 AM [facility]tico[facility] Monitoring [facility]oc[facility] INR Date 02/20/2023 03/20/2023 04/19/2023 Associated INR 2.4 2.5 2.7 Instructions 11.25 mg every Sun, Wed, Fri; 7.5 mg all other days 11.25 mg every Sun, Wed, Fri; 7.5 mg all other days 11.25 mg every Sun, Wed, Fri; 7.5 mg all other days Sunday Dose 11.25 mg 11.25 mg 11.25 mg Monday Dose 7.5 mg 7.5 mg 7.5 mg Tuesday Dose 7.5 mg 7.5 mg 7.5 mg Wednesday Dose 11.25 mg 11.25 mg 11.25 mg Thursday Dose 7.5 mg 7.5 mg 7.5 mg Friday Dose 11.25 mg 11.25 mg 11.25 mg Saturday Dose 7.5 mg 7.5 mg 7.5 mg Last 7 day dose total 63.75 mg 63.75 mg 63.75 mg Next INR Date 03/20/2023 04/19/2023 05/17/2023 The following findings were reported from today's visit (no findings if none selected):[]  Missed doses []  Extra doses []  Change in medications [x]  Change in vitamin k rich food intake (green vegetables, herbals, etc) [x]  Change in alcohol use []  Change in overall health []  Recent hospitalization / emergency department visit [x]  Upcoming procedure (including dental procedures) [x]  Other Comments:       See below The following side effects were reported from today's visit (no side effects if none selected):[]  Bleeding []  New bruising []  Warfarin-emergency department visit or hospital admission []  Other Comments:   No side effects Assessment/Plan:Patient's INR is 2.7, which is within goal range of 2.0-3.0.   Patient reports eating asparagus on Friday 5/24 and patient reports their usual vitamin K intake is green beans a few times a week (increase in vitamin K may decrease INR).  Patient reprots slight increase in actvity with the recent warmer weather such as yard work and cutting grass (increase in activity may decrease INR).  Patient reports slight decrease in alcohol intake (decrease in alcohol intake may decrease INR).  Patient reports possible colonoscopy in the Fall, no date set at this time but patient expressed understanding to reach out to clinic if/when a date is set.   He was instructed to continue current weekly dose as indicated in the maintenance plan below.  Next INR assessment due 4 weeks. This information was routed to the covering provider for review.Anticoagulation Summary  As of 04/19/2023  INR goal:  2.0-3.0 TTR:  84.3 % (8.5 mo) INR used for dosing:  2.7 (04/19/2023) Warfarin maintenance plan:  11.25 mg (7.5 mg x 1.5) every Sun, Wed, Fri; 7.5 mg (7.5 mg x 1) all other days Weekly warfarin total:  63.75 mg Plan last modified:  [name], [facility] (12/06/2022) Next INR check:  05/17/2023 Priority:  Maintenance Target end date:  Indefinite  Indications  Deep vein thrombosis (DVT) of calf muscle vein of right lower extremityunspecified chronicity (HC Code) [I82.461]   Anticoagulation Episode Summary   INR check location:  POCT (Point of Care Test)  Preferred lab:  [facility]  Send INR reminders to:  [facility] OUTPT INR POOL  Comments:    Anticoagulation Care Providers   Provider Role Specialty Phone number  Lenetta Quaker, DO Referring Internal Medicine [phone]  Raelyn Mora, MD Responsible Cardiovascular Dis [phone]  All patient's medications have been reviewed and updated as needed.  Electronically Signed by Louisa Second, PharmD, May 29, 2024Supervised by Luna Kitchens, [facility]  [address]  [place]  Phone: [phone]  Fax: [phone]

## 2023-05-17 ENCOUNTER — Encounter
Admit: 2023-05-17 | Payer: PRIVATE HEALTH INSURANCE | Attending: Pharmacist Clinician (PhC)/ Clinical Pharmacy Specialist | Primary: Internal Medicine

## 2023-05-17 ENCOUNTER — Ambulatory Visit
Admit: 2023-05-17 | Payer: BLUE CROSS/BLUE SHIELD | Attending: Pharmacist Clinician (PhC)/ Clinical Pharmacy Specialist | Primary: Internal Medicine

## 2023-05-17 DIAGNOSIS — I82409 Acute embolism and thrombosis of unspecified deep veins of unspecified lower extremity: Secondary | ICD-10-CM

## 2023-05-17 DIAGNOSIS — I10 Essential (primary) hypertension: Secondary | ICD-10-CM

## 2023-05-17 DIAGNOSIS — E119 Type 2 diabetes mellitus without complications: Secondary | ICD-10-CM

## 2023-05-17 MED ORDER — WARFARIN 7.5 MG TABLET
7.5 mg | ORAL_TABLET | 4 refills | Status: AC
Start: 2023-05-17 — End: ?

## 2023-05-17 NOTE — Unmapped
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  He presents to the clinic today, 05/17/2023, for routine INR monitoring. Patient's INR findings and prescribed dose from last visit:  03/20/2023  10:00 AM 04/19/2023   9:50 AM 05/17/2023   9:50 AM [facility]tico[facility] Monitoring [facility]oc[facility] INR Date 03/20/2023 04/19/2023 05/17/2023 Associated INR 2.5 2.7 2.2 Instructions 11.25 mg every Sun, Wed, Fri; 7.5 mg all other days 11.25 mg every Sun, Wed, Fri; 7.5 mg all other days 11.25 mg every Sun, Wed, Fri; 7.5 mg all other days Sunday Dose 11.25 mg 11.25 mg 11.25 mg Monday Dose 7.5 mg 7.5 mg 7.5 mg Tuesday Dose 7.5 mg 7.5 mg 7.5 mg Wednesday Dose 11.25 mg 11.25 mg 11.25 mg Thursday Dose 7.5 mg 7.5 mg 7.5 mg Friday Dose 11.25 mg 11.25 mg 11.25 mg Saturday Dose 7.5 mg 7.5 mg 7.5 mg Last 7 day dose total 63.75 mg 63.75 mg 63.75 mg Next INR Date 04/19/2023 05/17/2023 06/14/2023 The following findings were reported from today's visit (no findings if none selected):[]  Missed doses []  Extra doses []  Change in medications []  Change in vitamin k rich food intake (green vegetables, herbals, etc) [x]  Change in alcohol use []  Change in overall health []  Recent hospitalization / emergency department visit []  Upcoming procedure (including dental procedures) [x]  Other Comments:       Less alcohol, more activity  The following side effects were reported from today's visit (no side effects if none selected):[]  Bleeding []  New bruising []  Warfarin-emergency department visit or hospital admission []  Other Comments:   No side effects Assessment/Plan:Patient's INR is 2.2, which is within goal range of 2.0-3.0.  Patient reports ongoing less alcohol (may decrease INR) and more activity (may decrease INR).  Patient had reported same at last visit.  INR remains in range.  He was instructed to continue current weekly dose as indicated in the maintenance plan below.  Next INR assessment due 4 weeks. This information was routed to the covering provider for review.Anticoagulation Summary  As of 05/17/2023  INR goal:  2.0-3.0 TTR:  85.9 % (9.4 mo) INR used for dosing:  2.2 (05/17/2023) Warfarin maintenance plan:  11.25 mg (7.5 mg x 1.5) every Sun, Wed, Fri; 7.5 mg (7.5 mg x 1) all other days Weekly warfarin total:  63.75 mg Plan last modified:  [name], [facility] (12/06/2022) Next INR check:  06/14/2023 Priority:  Maintenance Target end date:  Indefinite  Indications  Deep vein thrombosis (DVT) of calf muscle vein of right lower extremityunspecified chronicity (HC Code) [I82.461]   Anticoagulation Episode Summary   INR check location:  POCT (Point of Care Test)  Preferred lab:  [facility]  Send INR reminders to:  [facility] OUTPT INR POOL  Comments:    Anticoagulation Care Providers   Provider Role Specialty Phone number  Lenetta Quaker, DO Referring Internal Medicine [phone]  Raelyn Mora, MD Responsible Cardiovascular Dis [phone]  All patient's medications have been reviewed and updated as needed.  Electronically Signed by [name], PharmD, June 26, 2024[facility]  [address]  [place]  Phone: [phone]  Fax: [phone]

## 2023-05-18 DIAGNOSIS — I82461 Acute embolism and thrombosis of right calf muscular vein: Secondary | ICD-10-CM

## 2023-06-14 ENCOUNTER — Encounter: Admit: 2023-06-14 | Payer: PRIVATE HEALTH INSURANCE | Primary: Internal Medicine

## 2023-06-14 ENCOUNTER — Ambulatory Visit: Admit: 2023-06-14 | Payer: BLUE CROSS/BLUE SHIELD | Primary: Internal Medicine

## 2023-06-14 DIAGNOSIS — I82461 Acute embolism and thrombosis of right calf muscular vein: Secondary | ICD-10-CM

## 2023-06-14 DIAGNOSIS — I10 Essential (primary) hypertension: Secondary | ICD-10-CM

## 2023-06-14 DIAGNOSIS — I82409 Acute embolism and thrombosis of unspecified deep veins of unspecified lower extremity: Secondary | ICD-10-CM

## 2023-06-14 DIAGNOSIS — E119 Type 2 diabetes mellitus without complications: Secondary | ICD-10-CM

## 2023-06-14 NOTE — Progress Notes
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  He presents to the clinic today, 06/14/2023, for routine INR monitoring. Patient's INR findings and prescribed dose from last visit:  04/19/2023   9:50 AM 05/17/2023   9:50 AM 06/14/2023   9:50 AM Antico[facility] Monitoring Assoc[facility] INR Date 04/19/2023 05/17/2023 06/14/2023 Associated INR 2.7 2.2 2.5 Instructions 11.25 mg every Sun, Wed, Fri; 7.5 mg all other days 11.25 mg every Sun, Wed, Fri; 7.5 mg all other days 11.25 mg every Sun, Wed, Fri; 7.5 mg all other days Sunday Dose 11.25 mg 11.25 mg 11.25 mg Monday Dose 7.5 mg 7.5 mg 7.5 mg Tuesday Dose 7.5 mg 7.5 mg 7.5 mg Wednesday Dose 11.25 mg 11.25 mg 11.25 mg Thursday Dose 7.5 mg 7.5 mg 7.5 mg Friday Dose 11.25 mg 11.25 mg 11.25 mg Saturday Dose 7.5 mg 7.5 mg 7.5 mg Last 7 day dose total 63.75 mg 63.75 mg 63.75 mg Next INR Date 05/17/2023 06/14/2023 07/12/2023 The following findings were reported from today's visit (no findings if none selected):[x]  Missed doses []  Extra doses []  Change in medications [x]  Change in vitamin k rich food intake (green vegetables, herbals, etc) [x]  Change in alcohol use []  Change in overall health []  Recent hospitalization / emergency department visit [x]  Upcoming procedure (including dental procedures) []  Other Comments:       Slight increase in greens, slight decrease in alcohol, colonoscopy planning for fall. May have missed a dose a couple of weeks ago. The following side effects were reported from today's visit (no side effects if none selected):[]  Bleeding [x]  New bruising []  Warfarin-emergency department visit or hospital admission []  Other Comments:   Minor bruise on finger Assessment/Plan:Patient's INR is 2.5, which is within goal range of 2.0-3.0.  Andrew [name] presents to the Anticoagulation Clinic today reporting that he had a small bruise on his right ring finger. He may have missed a dose a couple of weeks ago, he did not double his dose. He has had a slight increase in greens intake eating cucumbers with the skin on (Can decrease INR). He has had a slight decrease in alcohol use. Andrew [name] is planning to have a colonoscopy this fall, he will follow-up with the clinic once he has scheduled it. He reports everything else has been stable and consistent (no s/s of bleed/clot, but will seek medical attention for any that may arise).  He was instructed to continue current weekly dose as indicated in the maintenance plan below.  Next INR assessment due 1 week. This information was routed to the covering provider for review.Anticoagulation Summary  As of 06/14/2023  INR goal:  2.0-3.0 TTR:  87.1 % (10.4 mo) INR used for dosing:  2.5 (06/14/2023) Warfarin maintenance plan:  11.25 mg (7.5 mg x 1.5) every Sun, Wed, Fri; 7.5 mg (7.5 mg x 1) all other days Weekly warfarin total:  63.75 mg Plan last modified:  [name], [facility] (12/06/2022) Next INR check:  07/12/2023 Priority:  Maintenance Target end date:  Indefinite  Indications  Deep vein thrombosis (DVT) of calf muscle vein of right lower extremityunspecified chronicity (HC Code) [I82.461]   Anticoagulation Episode Summary   INR check location:  POCT (Point of Care Test)  Preferred lab:  [facility]  Send INR reminders to:  [facility] OUT[facility] INR POOL  Comments:    Anticoagulation Care Providers   Provider Role Specialty Phone number  Lenetta Quaker, DO Referring Internal Medicine [phone]  Raelyn Mora, MD Responsible Cardiovascular Dis [phone]  All patient's medications have been reviewed and updated as needed.  I spent 15 min on direct patient education and consulting.The following education was provided for  maintenance therapy :Dosing instructions, Administration considerations (e.g., missed dose management, consistent dosing time), Interacting medications, Signs/symptoms of clot or stroke, and Dietary considerations while taking warfarin.Electronically Signed by Delbert Harness, PharmD, July 24, 2024[facility]  [address]  [place]  Phone: [phone]  Fax: [phone]

## 2023-07-12 ENCOUNTER — Encounter
Admit: 2023-07-12 | Payer: PRIVATE HEALTH INSURANCE | Attending: Pharmacist Clinician (PhC)/ Clinical Pharmacy Specialist | Primary: Internal Medicine

## 2023-07-12 ENCOUNTER — Ambulatory Visit: Admit: 2023-07-12 | Payer: BLUE CROSS/BLUE SHIELD | Primary: Internal Medicine

## 2023-07-12 DIAGNOSIS — I82461 Acute embolism and thrombosis of right calf muscular vein: Secondary | ICD-10-CM

## 2023-07-12 DIAGNOSIS — I10 Essential (primary) hypertension: Secondary | ICD-10-CM

## 2023-07-12 DIAGNOSIS — E119 Type 2 diabetes mellitus without complications: Secondary | ICD-10-CM

## 2023-07-12 DIAGNOSIS — I82409 Acute embolism and thrombosis of unspecified deep veins of unspecified lower extremity: Secondary | ICD-10-CM

## 2023-07-12 NOTE — Progress Notes
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  He presents to the clinic today, 07/12/2023, for routine INR monitoring. Patient's INR findings and prescribed dose from last visit:  05/17/2023   9:50 AM 06/14/2023   9:50 AM 07/12/2023   9:50 AM Anticoagulation Monitoring Assoc. INR Date 05/17/2023 06/14/2023 07/12/2023 Associated INR 2.2 2.5 1.7 Instructions 11.25 mg every Sun, Wed, Fri; 7.5 mg all other days 11.25 mg every Sun, Wed, Fri; 7.5 mg all other days 8/22: 11.25 mg; 8/29: 11.25 mg; Otherwise 11.25 mg every Sun, Wed, Fri; 7.5 mg all other days Sunday Dose 11.25 mg 11.25 mg 11.25 mg Monday Dose 7.5 mg 7.5 mg 7.5 mg Tuesday Dose 7.5 mg 7.5 mg 7.5 mg Wednesday Dose 11.25 mg 11.25 mg 11.25 mg Thursday Dose 7.5 mg 7.5 mg 11.25 mg (8/22) Friday Dose 11.25 mg 11.25 mg 11.25 mg Saturday Dose 7.5 mg 7.5 mg 7.5 mg Last 7 day dose total 63.75 mg 63.75 mg 63.75 mg Next INR Date 06/14/2023 07/12/2023 07/20/2023 The following findings were reported from today's visit (no findings if none selected):[x]  Missed doses []  Extra doses []  Change in medications [x]  Change in vitamin k rich food intake (green vegetables, herbals, etc) [x]  Change in alcohol use []  Change in overall health []  Recent hospitalization / emergency department visit [x]  Upcoming procedure (including dental procedures) [x]  Other Comments:       See below The following side effects were reported from today's visit (no side effects if none selected):[]  Bleeding []  New bruising []  Warfarin-emergency department visit or hospital admission []  Other Comments:   No side effects Assessment/Plan:Patient's INR is 1.7, which is below goal range of 2.0-3.0.  Patient reports missing his warfarin 7.5 mg dose (11% dose decrease ) two Saturdays ago, which is 10 days from today's INR.  Would not expect missed dose to be reflected in today's INR, but could be possible.  Patient reports continues with more greens, less alcohol and more activity recently which all may decrease INR.  He reports he is due to a colonoscopy this fall but has not been referred to GI yet, so would not expect this procedure to happen anytime soon.  He will keep clinic informed.  He was instructed to increase weekly dose by 6% as indicated in the maintenance plan below.  Next INR assessment due 1 week. This information was routed to the covering provider for review.   Consult to Dr. Raelyn Mora, MD, clinic medical director, for 6% dose increase for INR 1.7 due to either missed dose 10 days ago or lifestyle changes.Anticoagulation Summary  As of 07/12/2023  INR goal:  2.0-3.0 TTR:  85.1 % (11.3 mo) INR used for dosing:  1.7 (07/12/2023) Warfarin maintenance plan:  11.25 mg (7.5 mg x 1.5) every Sun, Wed, Fri; 7.5 mg (7.5 mg x 1) all other days Weekly warfarin total:  63.75 mg Plan last modified:  [name], [facility] (12/06/2022) Next INR check:  07/20/2023 Priority:  Maintenance Target end date:  Indefinite  Indications  Deep vein thrombosis (DVT) of calf muscle vein of right lower extremityunspecified chronicity (HC Code) [I82.461]   Anticoagulation Episode Summary   INR check location:  POCT (Point of Care Test)  Preferred lab:  [facility]  Send INR reminders to:  [facility] OUT[facility] INR POOL  Comments:    Anticoagulation Care Providers   Provider Role Specialty Phone number  Lenetta Quaker, DO Referring Internal Medicine [phone]  Raelyn Mora, MD Responsible Cardiovascular Dis [phone]  All patient's medications have been reviewed and updated as needed.  I spent 15 min on direct patient education and consulting.The following education was provided for reeducation for out of range [id number] instructions, Administration considerations (e.g., missed dose management, consistent dosing time), Blood tests and importance of follow-up monitoring, Dietary considerations while taking warfarin, and Safety considerations while taking warfarin.Electronically Signed by [name], PharmD, August 21, 2024[facility]  [address]  [place]  Phone: [phone]  Fax: [phone]

## 2023-07-20 ENCOUNTER — Encounter: Admit: 2023-07-20 | Payer: PRIVATE HEALTH INSURANCE | Primary: Internal Medicine

## 2023-07-20 ENCOUNTER — Ambulatory Visit: Admit: 2023-07-20 | Payer: BLUE CROSS/BLUE SHIELD | Primary: Internal Medicine

## 2023-07-20 DIAGNOSIS — I82409 Acute embolism and thrombosis of unspecified deep veins of unspecified lower extremity: Secondary | ICD-10-CM

## 2023-07-20 DIAGNOSIS — I10 Essential (primary) hypertension: Secondary | ICD-10-CM

## 2023-07-20 DIAGNOSIS — E119 Type 2 diabetes mellitus without complications: Secondary | ICD-10-CM

## 2023-07-20 DIAGNOSIS — I82461 Acute embolism and thrombosis of right calf muscular vein: Secondary | ICD-10-CM

## 2023-07-20 NOTE — Progress Notes
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  He presents to the clinic today, 07/20/2023, for routine INR monitoring. Patient's INR findings and prescribed dose from last visit:  06/14/2023   9:50 AM 07/12/2023   9:50 AM 07/20/2023  10:30 AM [facility] INR Date 06/14/2023 07/12/2023 07/20/2023 Associated INR 2.5 1.7 2.3 Instructions 11.25 mg every Sun, Wed, Fri; 7.5 mg all other days 8/22: 11.25 mg; 8/29: 11.25 mg; Otherwise 11.25 mg every Sun, Wed, Fri; 7.5 mg all other days 7.5 mg every Mon, Thu, Sat; 11.25 mg all other days Sunday Dose 11.25 mg 11.25 mg 11.25 mg Monday Dose 7.5 mg 7.5 mg 7.5 mg Tuesday Dose 7.5 mg 7.5 mg 11.25 mg Wednesday Dose 11.25 mg 11.25 mg 11.25 mg Thursday Dose 7.5 mg 11.25 mg (8/22) 7.5 mg Friday Dose 11.25 mg 11.25 mg 11.25 mg Saturday Dose 7.5 mg 7.5 mg 7.5 mg Last 7 day dose total 63.75 mg 63.75 mg 67.5 mg Next INR Date 07/12/2023 07/20/2023 08/03/2023 The following findings were reported from today's visit (no findings if none selected):[]  Missed doses []  Extra doses []  Change in medications [x]  Change in vitamin k rich food intake (green vegetables, herbals, etc) []  Change in alcohol use []  Change in overall health []  Recent hospitalization / emergency department visit []  Upcoming procedure (including dental procedures) [x]  Other Comments:       Changes in Greens intake, increase in activity The following side effects were reported from today's visit (no side effects if none selected):[]  Bleeding []  New bruising []  Warfarin-emergency department visit or hospital admission []  Other Comments:   No side effects Assessment/Plan:Patient's INR is 2.3, which is within goal range of 2.0-3.0.  Andrew [name] presents to the Anticoagulation Clinic today reporting that he had been eating a lot of fresh vegetable (can decrease INR), but after last weeks low INR he avoided them a bit (avoidance can increase INR). He reports and increase in activity (which can decrease INR). He reports everything else has been stable and consistent (no s/s of bleed/clot, but will seek medical attention for any that may arise).  He was instructed to continue current weekly dose as indicated in the maintenance plan below.  Next INR assessment due 2 weeks. This information was routed to the covering provider for review.Anticoagulation Summary  As of 07/20/2023  INR goal:  2.0-3.0 TTR:  84.3 % (11.6 mo) INR used for dosing:  2.3 (07/20/2023) Warfarin maintenance plan:  7.5 mg (7.5 mg x 1) every Mon, Thu, Sat; 11.25 mg (7.5 mg x 1.5) all other days Weekly warfarin total:  67.5 mg Plan last modified:  Delbert Harness, PharmD (07/20/2023) Next INR check:  08/03/2023 Priority:  Maintenance Target end date:  Indefinite  Indications  Deep vein thrombosis (DVT) of calf muscle vein of right lower extremityunspecified chronicity (HC Code) [I82.461]   Anticoagulation Episode Summary   INR check location:  POCT (Point of Care Test)  Preferred lab:  [facility]  Send INR reminders to:  [facility] OUTPT INR POOL  Comments:    Anticoagulation Care Providers   Provider Role Specialty Phone number  Lenetta Quaker, DO Referring Internal Medicine [phone]  Raelyn Mora, MD Responsible Cardiovascular Dis [phone]  All patient's medications have been reviewed and updated as  needed.  I spent 15 min on direct patient education and consulting.The following education was provided for  maintenance therapy :Dosing instructions, Signs/symptoms of clot or stroke, Dietary considerations while taking warfarin, and Safety considerations while taking warfarin.Electronically Signed by Delbert Harness, PharmD, August 29, 2024[facility]  [address]  [place]  Phone: [phone]  Fax: [phone]

## 2023-08-03 ENCOUNTER — Ambulatory Visit: Admit: 2023-08-03 | Payer: BLUE CROSS/BLUE SHIELD | Primary: Internal Medicine

## 2023-08-03 ENCOUNTER — Encounter: Admit: 2023-08-03 | Payer: PRIVATE HEALTH INSURANCE | Primary: Internal Medicine

## 2023-08-03 DIAGNOSIS — I10 Essential (primary) hypertension: Secondary | ICD-10-CM

## 2023-08-03 DIAGNOSIS — I82409 Acute embolism and thrombosis of unspecified deep veins of unspecified lower extremity: Secondary | ICD-10-CM

## 2023-08-03 DIAGNOSIS — I82461 Acute embolism and thrombosis of right calf muscular vein: Secondary | ICD-10-CM

## 2023-08-03 DIAGNOSIS — E119 Type 2 diabetes mellitus without complications: Secondary | ICD-10-CM

## 2023-08-03 NOTE — Progress Notes
 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  He presents to the clinic today, 08/03/2023, for routine INR monitoring. Patient's INR findings and prescribed dose from last visit:  07/12/2023   9:50 AM 07/20/2023  10:30 AM 08/03/2023  10:00 AM Antico[facility] Monitoring [facility]oc[facility] INR Date 07/12/2023 07/20/2023 08/03/2023 Associated INR 1.7 2.3 3.7 Instructions 8/22: 11.25 mg; 8/29: 11.25 mg; Otherwise 11.25 mg every Sun, Wed, Fri; 7.5 mg all other days 7.5 mg every Mon, Thu, Sat; 11.25 mg all other days 9/13: Hold; 9/14: 11.25 mg; Otherwise 11.25 mg every Sun, Wed, Fri; 7.5 mg all other days Sunday Dose 11.25 mg 11.25 mg 11.25 mg Monday Dose 7.5 mg 7.5 mg 7.5 mg Tuesday Dose 7.5 mg 11.25 mg 7.5 mg Wednesday Dose 11.25 mg 11.25 mg 11.25 mg Thursday Dose 11.25 mg (8/22) 7.5 mg 7.5 mg Friday Dose 11.25 mg 11.25 mg Hold (9/13) Saturday Dose 7.5 mg 7.5 mg 11.25 mg (9/14) Last 7 day dose total 63.75 mg 67.5 mg 67.5 mg Next INR Date 07/20/2023 08/03/2023 08/11/2023 The following findings were reported from today's visit (no findings if none selected):[]  Missed doses []  Extra doses []  Change in medications [x]  Change in vitamin k rich food intake (green vegetables, herbals, etc) []  Change in alcohol use []  Change in overall health []  Recent hospitalization / emergency department visit []  Upcoming procedure (including dental procedures) []  Other Comments:       Less greens (increase in other non-green vegetables) The following side effects were reported from today's visit (no side effects if none selected):[]  Bleeding []  New bruising []  Warfarin-emergency department visit or hospital admission []  Other Comments:   No side effects Assessment/Plan:Patient's INR is 3.7, which is above goal range of 2.0-3.0.   Andrew [name] presents to the Anticoagulation Clinic today reporting that he has been eating less green vegetable. Everything else has been stable and consistent (no s/s of bleed/clot, but will seek medical attention for any that may arise).  He was instructed to hold 1 dose(s) as indicated in the maintenance plan below.  Next INR assessment due 1 week. This information was routed to the covering provider for review.Anticoagulation Summary  As of 08/03/2023  INR goal:  2.0-3.0 TTR:  83.0 % (1 y) INR used for dosing:  3.7 (08/03/2023) Warfarin maintenance plan:  11.25 mg (7.5 mg x 1.5) every Sun, Wed, Fri; 7.5 mg (7.5 mg x 1) all other days Weekly warfarin total:  63.75 mg Plan last modified:  Delbert Harness, PharmD (08/03/2023) Next INR check:  08/11/2023 Priority:  Maintenance Target end date:  Indefinite  Indications  Deep vein thrombosis (DVT) of calf muscle vein of right lower extremityunspecified chronicity (HC Code) [I82.461]   Anticoagulation Episode Summary   INR check location:  POCT (Point of Care Test)  Preferred lab:  [facility]  Send INR reminders to:  [facility] OUTPT INR POOL  Comments:    Anticoagulation Care Providers   Provider Role Specialty Phone number  Lenetta Quaker, DO Referring Internal Medicine [phone]  Raelyn Mora, MD Responsible Cardiovascular Dis [phone]  All patient's medications have been reviewed and updated as needed.  I spent 15 min on direct patient education and consulting.The following education was provided for reeducation for out of range GEX:BMWUXL instructions, Dietary considerations  while taking warfarin, and Safety considerations while taking warfarin.Electronically Signed by Delbert Harness, PharmD, September 12, 2024[facility]  [address]  [place]  Phone: [phone]  Fax: [phone]

## 2023-08-11 ENCOUNTER — Encounter: Admit: 2023-08-11 | Payer: PRIVATE HEALTH INSURANCE | Attending: Pharmacist | Primary: Internal Medicine

## 2023-08-11 ENCOUNTER — Ambulatory Visit: Admit: 2023-08-11 | Payer: BLUE CROSS/BLUE SHIELD | Primary: Internal Medicine

## 2023-08-11 DIAGNOSIS — I82461 Acute embolism and thrombosis of right calf muscular vein: Secondary | ICD-10-CM

## 2023-08-11 DIAGNOSIS — I82409 Acute embolism and thrombosis of unspecified deep veins of unspecified lower extremity: Secondary | ICD-10-CM

## 2023-08-11 DIAGNOSIS — I10 Essential (primary) hypertension: Secondary | ICD-10-CM

## 2023-08-11 DIAGNOSIS — E119 Type 2 diabetes mellitus without complications: Secondary | ICD-10-CM

## 2023-08-11 NOTE — Progress Notes
 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  He presents to the clinic today, 08/11/2023, for routine INR monitoring. Patient's INR findings and prescribed dose from last visit:  07/20/2023  10:30 AM 08/03/2023  10:00 AM 08/11/2023  10:30 AM Antico[facility] Monitoring Assoc[facility] INR Date 07/20/2023 08/03/2023 08/11/2023 Associated INR 2.3 3.7 1.9 Instructions 7.5 mg every Mon, Thu, Sat; 11.25 mg all other days 9/13: Hold; 9/14: 11.25 mg; Otherwise 11.25 mg every Sun, Wed, Fri; 7.5 mg all other days 9/20: 7.5 mg; Otherwise 11.25 mg every Wed, Sat; 7.5 mg all other days Sunday Dose 11.25 mg 11.25 mg 7.5 mg Monday Dose 7.5 mg 7.5 mg 7.5 mg Tuesday Dose 11.25 mg 7.5 mg 7.5 mg Wednesday Dose 11.25 mg 11.25 mg 11.25 mg Thursday Dose 7.5 mg 7.5 mg - Friday Dose 11.25 mg Hold (9/13) 7.5 mg (9/20) Saturday Dose 7.5 mg 11.25 mg (9/14) 11.25 mg Last 7 day dose total 67.5 mg 67.5 mg 56.25 mg Next INR Date 08/03/2023 08/11/2023 08/17/2023 The following findings were reported from today's visit (no findings if none selected):[]  Missed doses []  Extra doses []  Change in medications []  Change in vitamin k rich food intake (green vegetables, herbals, etc) []  Change in alcohol use []  Change in overall health []  Recent hospitalization / emergency department visit [x]  Upcoming procedure (including dental procedures) []  Other Comments:       Significant decrease in green intake The following side effects were reported from today's visit (no side effects if none selected):[]  Bleeding []  New bruising []  Warfarin-emergency department visit or hospital admission []  Other Comments:   No side effects Assessment/Plan:Patient's INR is 1.9, which is below goal range of 2.0-3.0.  INR decreased from 3.7 on 9/12 to 1.9 today after holding warfarin x 1 dose, then taking as outlined on tracker. Patient reports a significant decrease in vitamin K intake (can increase INR). Patient reports anticipating a colonscopy this fall, but no date yet. Patient agrees to keep clinic updated when colonoscopy scheduled.  Since INR decreased by > 1 on 56.25mg /wk, will slightly increase dose to prevent further decrease in INR.  Patient reports taking warfarin 7.5mg  this am. Will give warfarin 11.25mg  tomorrow, then as outlined on tracker (about 7% dose increase from dose taken the past 7 days). Will recheck in 1 week .  He was instructed to  take  as indicated in the maintenance plan below.  Next INR assessment due 1 week. This information was routed to the covering provider for review.Consult to Dr. Raelyn Mora, MD, clinic medical director, for management of INR that decreased from 3.7 on 9/12 to 1.9 today.Anticoagulation Summary  As of 08/11/2023  INR goal:  2.0-3.0 TTR:  82.4 % (1 y) INR used for dosing:  1.9 (08/11/2023) Warfarin maintenance plan:  11.25 mg (7.5 mg x 1.5) every Wed, Sat; 7.5 mg (7.5 mg x 1) all other days Weekly warfarin total:  60 mg Plan last modified:  [name], [facility] (08/11/2023) Next INR check:  08/17/2023 Priority:  Maintenance Target end date:  Indefinite  Indications  Deep vein thrombosis (DVT) of calf muscle vein of right lower extremityunspecified chronicity (HC Code) [I82.461]   Anticoagulation Episode Summary   INR check location:  POCT [facility] Care Test)  Preferred lab:  [facility]  SYSTEM LAB  Send INR reminders to:  [facility] OUTPT INR POOL  Comments:    Anticoagulation Care Providers   Provider Role Specialty Phone number  Lenetta Quaker, DO Referring Internal Medicine [phone]  Raelyn Mora, MD Responsible Cardiovascular Dis [phone]  All patient's medications have been reviewed and updated as needed.  I spent 15 min on direct patient education and consulting.The following education was provided for  : :Dosing instructions, Blood tests and importance of follow-up monitoring, and Dietary considerations while taking warfarin.Electronically Signed by [name], RPH, September 20, 2024[facility]  [address]  [place]  Phone: [phone]  Fax: [phone]

## 2023-08-17 ENCOUNTER — Encounter: Admit: 2023-08-17 | Payer: PRIVATE HEALTH INSURANCE | Attending: Pharmacist | Primary: Internal Medicine

## 2023-08-17 ENCOUNTER — Ambulatory Visit: Admit: 2023-08-17 | Payer: BLUE CROSS/BLUE SHIELD | Attending: Pharmacist | Primary: Internal Medicine

## 2023-08-17 DIAGNOSIS — E119 Type 2 diabetes mellitus without complications: Secondary | ICD-10-CM

## 2023-08-17 DIAGNOSIS — I82409 Acute embolism and thrombosis of unspecified deep veins of unspecified lower extremity: Secondary | ICD-10-CM

## 2023-08-17 DIAGNOSIS — I82461 Acute embolism and thrombosis of right calf muscular vein: Secondary | ICD-10-CM

## 2023-08-17 DIAGNOSIS — I10 Essential (primary) hypertension: Secondary | ICD-10-CM

## 2023-08-17 NOTE — Progress Notes
 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  He presents to the clinic today, 08/17/2023, for routine INR monitoring. Patient's INR findings and prescribed dose from last visit:  08/03/2023  10:00 AM 08/11/2023  10:30 AM 08/17/2023  10:00 AM Anticoagulation Monitoring Assoc. INR Date 08/03/2023 08/11/2023 08/17/2023 Associated INR 3.7 1.9 2.6 Instructions 9/13: Hold; 9/14: 11.25 mg; Otherwise 11.25 mg every Sun, Wed, Fri; 7.5 mg all other days 9/20: 7.5 mg; Otherwise 11.25 mg every Wed, Sat; 7.5 mg all other days 11.25 mg every Wed, Sat; 7.5 mg all other days Sunday Dose 11.25 mg 7.5 mg 7.5 mg Monday Dose 7.5 mg 7.5 mg 7.5 mg Tuesday Dose 7.5 mg 7.5 mg 7.5 mg Wednesday Dose 11.25 mg 11.25 mg 11.25 mg Thursday Dose 7.5 mg - 7.5 mg Friday Dose Hold (9/13) 7.5 mg (9/20) 7.5 mg Saturday Dose 11.25 mg (9/14) 11.25 mg 11.25 mg Last 7 day dose total 67.5 mg 56.25 mg 60 mg Next INR Date 08/11/2023 08/17/2023 08/24/2023 The following findings were reported from today's visit (no findings if none selected):[]  Missed doses []  Extra doses []  Change in medications []  Change in vitamin k rich food intake (green vegetables, herbals, etc) []  Change in alcohol use []  Change in overall health []  Recent hospitalization / emergency department visit [x]  Upcoming procedure (including dental procedures) [x]  Other Comments:       Anticipating colonoscopy this fall, increase in activity The following side effects were reported from today's visit (no side effects if none selected):[]  Bleeding []  New bruising []  Warfarin-emergency department visit or hospital admission []  Other Comments:   No side effects Assessment/Plan:Patient's INR is 2.6, which is within goal range of 2.0-3.0.  INR increased from 1.9 on 9/20 to 2.6 today after 7% dose increase last week.Patient reports increase in activity, that he has been chopping wood (increase in activity can decrease INR).Patient reports no date yet for anticipated upcoming colonoscopy, but agrees to keep clinic updated.  Will continue current dose and recheck in 1 week .  He was instructed to continue current weekly dose as indicated in the maintenance plan below.  Next INR assessment due 1 week. This information was routed to the covering provider for review.Anticoagulation Summary  As of 08/17/2023  INR goal:  2.0-3.0 TTR:  82.4 % (1 y) INR used for dosing:  2.6 (08/17/2023) Warfarin maintenance plan:  11.25 mg (7.5 mg x 1.5) every Wed, Sat; 7.5 mg (7.5 mg x 1) all other days Weekly warfarin total:  60 mg Plan last modified:  [name], [facility] (08/11/2023) Next INR check:  08/24/2023 Priority:  Maintenance Target end date:  Indefinite  Indications  Deep vein thrombosis (DVT) of calf muscle vein of right lower extremityunspecified chronicity (HC Code) [I82.461]   Anticoagulation Episode Summary   INR check location:  POCT (Point of Care Test)  Preferred lab:  [facility]  Send INR reminders to:  [facility] OUTPT INR POOL  Comments:    Anticoagulation Care Providers   Provider Role Specialty Phone number  Lenetta Quaker, DO Referring Internal Medicine [phone]  Raelyn Mora, MD Responsible Cardiovascular Dis [phone]  All patient's medications have been reviewed and updated as needed.  I spent 15 min on direct patient education and consulting.The following education was  provided for  : :Dosing instructions and activity considerations while taking warfarin .Electronically Signed by [name], RPH, September 26, 2024[facility]  [address]  [place]  Phone: [phone]  Fax: [phone]

## 2023-08-24 ENCOUNTER — Ambulatory Visit: Admit: 2023-08-24 | Payer: BLUE CROSS/BLUE SHIELD | Primary: Internal Medicine

## 2023-08-24 DIAGNOSIS — I82461 Acute embolism and thrombosis of right calf muscular vein: Secondary | ICD-10-CM

## 2023-08-24 NOTE — Progress Notes
 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  He presents to the clinic today, 08/24/2023, for routine INR monitoring. Patient's INR findings and prescribed dose from last visit:  08/11/2023  10:30 AM 08/17/2023  10:00 AM 08/24/2023  10:00 AM Anticoagulation Monitoring Assoc. INR Date 08/11/2023 08/17/2023 08/24/2023 Associated INR 1.9 2.6 4.3 Instructions 9/20: 7.5 mg; Otherwise 11.25 mg every Wed, Sat; 7.5 mg all other days 11.25 mg every Wed, Sat; 7.5 mg all other days 10/4: Hold; Otherwise 11.25 mg every Wed, Sat; 7.5 mg all other days Sunday Dose 7.5 mg 7.5 mg 7.5 mg Monday Dose 7.5 mg 7.5 mg 7.5 mg Tuesday Dose 7.5 mg 7.5 mg 7.5 mg Wednesday Dose 11.25 mg 11.25 mg 11.25 mg Thursday Dose - 7.5 mg 7.5 mg Friday Dose 7.5 mg (9/20) 7.5 mg Hold (10/4) Saturday Dose 11.25 mg 11.25 mg 11.25 mg Last 7 day dose total 56.25 mg 60 mg 60 mg Next INR Date 08/17/2023 08/24/2023 08/31/2023 The following findings were reported from today's visit (no findings if none selected):[]  Missed doses []  Extra doses []  Change in medications []  Change in vitamin k rich food intake (green vegetables, herbals, etc) []  Change in alcohol use []  Change in overall health []  Recent hospitalization / emergency department visit []  Upcoming procedure (including dental procedures) [x]  Other Comments:       Increase in activity The following side effects were reported from today's visit (no side effects if none selected):[]  Bleeding []  New bruising []  Warfarin-emergency department visit or hospital admission []  Other Comments:   No side effects Assessment/Plan:Patient's INR is 4.3, which is above goal range of 2.0-3.0. INR increased from 2.6 on 9/26 to 4.3 today. Patient reports increase in activity, has been chopping wood (increase in activity can decrease INR). Patient reports his diet is low in vitamin K, that he has 2-3 greens low in vitamin K content 2-3 times a week (string beans and iceberg lettuce). He reports his vitamin K intake is lower now than it was during the summer when he ate more greens high in vitamin K content.  Patient reports already taking warfarin 7.5mg  this am. Patient instructed to increase vitamin K content, to have vitamin K today. Patient reminded he is at risk for bleeding, to use caution, and to go to the ED for any bleeding that won't stop or should he fall or hit his head. Will hold warfarin tomorrow, then as outlined on tracker (about 12% dose decrease). Will recheck INR in 1 week .  He was instructed to hold 1 dose(s) as indicated in the maintenance plan below.  Next INR assessment due 1 week. This information was routed to the covering provider for review.Anticoagulation Summary  As of 08/24/2023  INR goal:  2.0-3.0 TTR:  81.4 % (1 y) INR used for dosing:  4.3 (08/24/2023) Warfarin maintenance plan:  11.25 mg (7.5 mg x 1.5) every Wed, Sat; 7.5 mg (7.5 mg x 1) all other days Weekly warfarin total:  60 mg Plan last modified:  [name], [facility] (08/11/2023) Next INR check:  08/31/2023 Priority:  Maintenance Target end date:  Indefinite  Indications  Deep vein thrombosis (DVT) of calf muscle vein of right lower extremityunspecified chronicity (HC Code) [I82.461]   Anticoagulation Episode Summary   INR check location:  POCT (Point of Care Test)  Preferred lab:  [facility]  Send INR reminders to:  [facility]ARMAC[facility] OUTPT INR POOL  Comments:    Anticoagulation Care Providers   Provider Role Specialty Phone number  Lenetta Quaker, DO Referring Internal Medicine [phone]  Raelyn Mora, MD Responsible Cardiovascular Dis [phone]  All patient's medications have been reviewed and updated as needed.  I spent 15 min on direct patient education and consulting.The following education was provided for reeducation for out of range YQM:VHQION instructions, Blood tests and importance of follow-up monitoring, Signs/symptoms of bleeding or bruising and management, Dietary considerations while taking warfarin, and Safety considerations while taking warfarin.Electronically Signed by [name], RPH, October 3, 2024[facility]  [address]  [place]  Phone: [phone]  Fax: [phone]

## 2023-08-31 ENCOUNTER — Ambulatory Visit: Admit: 2023-08-31 | Payer: BLUE CROSS/BLUE SHIELD | Primary: Internal Medicine

## 2023-08-31 ENCOUNTER — Encounter: Admit: 2023-08-31 | Payer: PRIVATE HEALTH INSURANCE | Primary: Internal Medicine

## 2023-08-31 DIAGNOSIS — I82409 Acute embolism and thrombosis of unspecified deep veins of unspecified lower extremity: Secondary | ICD-10-CM

## 2023-08-31 DIAGNOSIS — I10 Essential (primary) hypertension: Secondary | ICD-10-CM

## 2023-08-31 DIAGNOSIS — I82461 Acute embolism and thrombosis of right calf muscular vein: Secondary | ICD-10-CM

## 2023-08-31 DIAGNOSIS — E119 Type 2 diabetes mellitus without complications: Secondary | ICD-10-CM

## 2023-08-31 NOTE — Progress Notes
 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  He presents to the clinic today, 08/31/2023, for routine INR monitoring. Patient's INR findings and prescribed dose from last visit:  08/17/2023  10:00 AM 08/24/2023  10:00 AM 08/31/2023   9:00 AM [facility] INR Date 08/17/2023 08/24/2023 08/31/2023 Associated INR 2.6 4.3 3.6 Instructions 11.25 mg every Wed, Sat; 7.5 mg all other days 10/4: Hold; Otherwise 11.25 mg every Wed, Sat; 7.5 mg all other days 10/11: Hold; 10/12: 7.5 mg; 10/16: 7.5 mg; Otherwise 11.25 mg every Wed, Sat; 7.5 mg all other days Sunday Dose 7.5 mg 7.5 mg 7.5 mg Monday Dose 7.5 mg 7.5 mg 7.5 mg Tuesday Dose 7.5 mg 7.5 mg 7.5 mg Wednesday Dose 11.25 mg 11.25 mg 7.5 mg (10/16) Thursday Dose 7.5 mg 7.5 mg 7.5 mg Friday Dose 7.5 mg Hold (10/4) Hold (10/11) Saturday Dose 11.25 mg 11.25 mg 7.5 mg (10/12) Last 7 day dose total 60 mg 60 mg 52.5 mg Next INR Date 08/24/2023 08/31/2023 09/07/2023 The following findings were reported from today's visit (no findings if none selected):[]  Missed doses []  Extra doses []  Change in medications [x]  Change in vitamin k rich food intake (green vegetables, herbals, etc) []  Change in alcohol use [x]  Change in overall health []  Recent hospitalization / emergency department visit [x]  Upcoming procedure (including dental procedures) []  Other Comments:       Increase in greens (Decrease INR), possible colonoscopy still unscheduled. Increase in activity (decrease INR) The following side effects were reported from today's visit (no side effects if none selected):[]  Bleeding []  New bruising []  Warfarin-emergency department visit or hospital admission []  Other Comments:   No side effects Assessment/Plan:Patient's INR is 3.6, which is above goal range of 2.0-3.0.   Andrew [name] presents to the Anticoagulation Clinic today reporting that he ate more greens this past week as directed, can decrease INR (Many of the greens included low or medium greens such as soy beans, green beans, green peppers, blackberries). He reports being active this week chopping wood (can decrease INR). He reports still needing to schedule his colonoscopy. He reports everything else has been stable and consistent (no s/s of bleed/clot, but will seek medical attention for any that may arise). Patient was encourage to again incorporate an additional green in his diet this week to help lower INR. He was instructed hold x 1 dose(s) of warfarin (patient already took today's dose, he will hold dose tomorrow) and reduce dose (7.5 mg all days) as indicated in the maintenance plan below.  Next INR assessment due in 1 week. This information was routed to the covering provider for review. Consult to Dr. Raelyn Mora, MD, clinic medical director, for management of second supratherapeutic INR in row, down to INR 3.6 from 4.3. Patient will Hold and reduce dose this week representing a 14% dose change overall.Anticoagulation Summary  As of 08/31/2023  INR goal:  2.0-3.0 TTR:  79.9 % (1.1 y) INR used for dosing:  3.6 (08/31/2023) Warfarin maintenance plan:  11.25 mg (7.5 mg x 1.5) every Wed, Sat; 7.5 mg (7.5 mg x 1) all other days Weekly warfarin total:  60 mg Plan last modified:  [name], [facility] (08/11/2023) Next INR check:  09/07/2023 Priority:  Maintenance Target end date:  Indefinite  Indications  Deep vein thrombosis (DVT) of calf muscle vein of right lower extremityunspecified chronicity (HC Code) [I82.461]   Anticoagulation Episode Summary   INR check location:  POCT (Point of Care Test)  Preferred lab: [facility]  Send INR reminders to:  [facility] OUTPT INR POOL  Comments:    Anticoagulation Care Providers   Provider Role Specialty Phone number  Lenetta Quaker, DO Referring Internal Medicine [phone]  Raelyn Mora, MD Responsible Cardiovascular Dis [phone]  All patient's medications have been reviewed and updated as needed.  I spent 15 min on direct patient education and consulting.The following education was provided for reeducation for out of range [id number] instructions, Administration considerations (e.g., missed dose management, consistent dosing time), Dietary considerations while taking warfarin, and Safety considerations while taking warfarin.Electronically Signed by Delbert Harness, PharmD, October 10, 2024[facility]  [address]  [place]  Phone: [phone]  Fax: [phone]

## 2023-09-07 ENCOUNTER — Encounter: Admit: 2023-09-07 | Payer: PRIVATE HEALTH INSURANCE | Attending: Pharmacist | Primary: Internal Medicine

## 2023-09-07 ENCOUNTER — Ambulatory Visit: Admit: 2023-09-07 | Payer: BLUE CROSS/BLUE SHIELD | Primary: Internal Medicine

## 2023-09-07 DIAGNOSIS — I82409 Acute embolism and thrombosis of unspecified deep veins of unspecified lower extremity: Secondary | ICD-10-CM

## 2023-09-07 DIAGNOSIS — I10 Essential (primary) hypertension: Secondary | ICD-10-CM

## 2023-09-07 DIAGNOSIS — E119 Type 2 diabetes mellitus without complications: Secondary | ICD-10-CM

## 2023-09-07 DIAGNOSIS — I82461 Acute embolism and thrombosis of right calf muscular vein: Principal | ICD-10-CM

## 2023-09-07 NOTE — Progress Notes
 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  He presents to the clinic today, 09/07/2023, for routine INR monitoring. Patient's INR findings and prescribed dose from last visit:  08/24/2023  10:00 AM 08/31/2023   9:00 AM 09/07/2023   9:30 AM Anticoagulation Monitoring Assoc. INR Date 08/24/2023 08/31/2023 09/07/2023 Associated INR 4.3 3.6 2.1 Instructions 10/4: Hold; Otherwise 11.25 mg every Wed, Sat; 7.5 mg all other days 10/11: Hold; 10/12: 7.5 mg; 10/16: 7.5 mg; Otherwise 11.25 mg every Wed, Sat; 7.5 mg all other days 10/19: 7.5 mg; 10/23: 7.5 mg; Otherwise 7.5 mg every day Sunday Dose 7.5 mg 7.5 mg 7.5 mg Monday Dose 7.5 mg 7.5 mg 7.5 mg Tuesday Dose 7.5 mg 7.5 mg 7.5 mg Wednesday Dose 11.25 mg 7.5 mg (10/16) 7.5 mg (10/23) Thursday Dose 7.5 mg 7.5 mg 7.5 mg Friday Dose Hold (10/4) Hold (10/11) 7.5 mg Saturday Dose 11.25 mg 7.5 mg (10/12) 7.5 mg (10/19) Last 7 day dose total 60 mg 52.5 mg 45 mg Next INR Date 08/31/2023 09/07/2023 09/14/2023 The following findings were reported from today's visit (no findings if none selected):[]  Missed doses []  Extra doses []  Change in medications [x]  Change in vitamin k rich food intake (green vegetables, herbals, etc) []  Change in alcohol use []  Change in overall health []  Recent hospitalization / emergency department visit []  Upcoming procedure (including dental procedures) []  Other Comments:       Massive increase in vitamin K intake The following side effects were reported from today's visit (no side effects if none selected):[]  Bleeding []  New bruising []  Warfarin-emergency department visit or hospital admission []  Other Comments:   No side effects Assessment/Plan:Patient's INR is 2.1, which is within goal range of 2.0-3.0.  INR decreased from 3.6 on 10/10 to 2.1 today after 14% dose decrease last week. Patient reports massive increase in vitamin K intake (can decrease INR).  Patient reports having massive amounts of greens, that he had a bottle of V8 juice, asparagus, and spinach, which is a significant increase in vitamin K from his usual iceberg lettuce, green beans, and green peppers. Patient reports he anticipates having a colonoscopy, no date yet. Patient agrees to keep clinic updated.  Patient instructed to avoid greens for a couple of days to prevent INR from continuing to decrease, possibly to subtherapeutic, as his INR dropped rapidly in the last 7 day. Will retry warfarin 7.5mg  daily, a dose he was previously stable on. Will recheck in 1 week. He was instructed to  take  as indicated in the maintenance plan below.  Next INR assessment due 1 week. This information was routed to the covering provider for review.Consult to Dr. Raelyn Mora, MD, clinic medical director, for management of INR that decreased from 3.6 on 10/10 to 2.1 today.Anticoagulation Summary  As of 09/07/2023  INR goal:  2.0-3.0 TTR:  79.5 % (1.1 y) INR used for dosing:  2.1 (09/07/2023) Warfarin maintenance plan:  7.5 mg (7.5 mg x 1) every day Weekly warfarin total:  52.5 mg Plan last modified:  Andrew [name], [facility] (09/07/2023) Next INR check:  09/14/2023 Priority:  Maintenance Target end date:  Indefinite  Indications  Deep vein thrombosis (DVT) of calf muscle vein of right lower extremityunspecified chronicity (HC Code) [I82.461]   Anticoagulation Episode Summary  INR check location:  POCT (Point of Care Test)  Preferred lab:  [facility]  Send INR reminders to:  [facility] OUTPT INR POOL  Comments:    Anticoagulation Care Providers   Provider Role Specialty Phone number  Andrew Quaker, DO Referring Internal Medicine [phone]  Andrew Mora, MD Responsible Cardiovascular Dis [phone]  All patient's medications have been reviewed and updated as needed.  I spent 15 min on direct patient education and consulting.The following education was provided for  : :Dosing instructions and Dietary considerations while taking warfarin.Electronically Signed by Andrew [name], RPH, October 17, 2024[facility]  [address]  [place]  Phone: [phone]  Fax: [phone]

## 2023-09-14 ENCOUNTER — Encounter: Admit: 2023-09-14 | Payer: PRIVATE HEALTH INSURANCE | Attending: Pharmacist | Primary: Internal Medicine

## 2023-09-14 ENCOUNTER — Ambulatory Visit: Admit: 2023-09-14 | Payer: MEDICARE | Primary: Internal Medicine

## 2023-09-14 DIAGNOSIS — I10 Essential (primary) hypertension: Secondary | ICD-10-CM

## 2023-09-14 DIAGNOSIS — I82461 Acute embolism and thrombosis of right calf muscular vein: Secondary | ICD-10-CM

## 2023-09-14 DIAGNOSIS — E119 Type 2 diabetes mellitus without complications: Secondary | ICD-10-CM

## 2023-09-14 DIAGNOSIS — I82409 Acute embolism and thrombosis of unspecified deep veins of unspecified lower extremity: Secondary | ICD-10-CM

## 2023-09-14 NOTE — Progress Notes
 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  He presents to the clinic today, 09/14/2023, for routine INR monitoring. Patient's INR findings and prescribed dose from last visit:  08/31/2023   9:00 AM 09/07/2023   9:30 AM 09/14/2023   9:30 AM Anticoagulation Monitoring Assoc. INR Date 08/31/2023 09/07/2023 09/14/2023 Associated INR 3.6 2.1 2.2 Instructions 10/11: Hold; 10/12: 7.5 mg; 10/16: 7.5 mg; Otherwise 11.25 mg every Wed, Sat; 7.5 mg all other days 10/19: 7.5 mg; 10/23: 7.5 mg; Otherwise 7.5 mg every day 7.5 mg every day Sunday Dose 7.5 mg 7.5 mg 7.5 mg Monday Dose 7.5 mg 7.5 mg 7.5 mg Tuesday Dose 7.5 mg 7.5 mg 7.5 mg Wednesday Dose 7.5 mg (10/16) 7.5 mg (10/23) 7.5 mg Thursday Dose 7.5 mg 7.5 mg 7.5 mg Friday Dose Hold (10/11) 7.5 mg 7.5 mg Saturday Dose 7.5 mg (10/12) 7.5 mg (10/19) 7.5 mg Last 7 day dose total 52.5 mg 45 mg 52.5 mg Next INR Date 09/07/2023 09/14/2023 09/21/2023 The following findings were reported from today's visit (no findings if none selected):[]  Missed doses []  Extra doses []  Change in medications [x]  Change in vitamin k rich food intake (green vegetables, herbals, etc) []  Change in alcohol use []  Change in overall health []  Recent hospitalization / emergency department visit []  Upcoming procedure (including dental procedures) []  Other Comments:       Decrease in greens The following side effects were reported from today's visit (no side effects if none selected):[]  Bleeding []  New bruising []  Warfarin-emergency department visit or hospital admission []  Other Comments:   No side effects Assessment/Plan:Patient's INR is 2.2, which is within goal range of 2.0-3.0.  Patient reports a decrease in greens (can increase INR).  Patient reports not having any greens this week.Patient reports he anticipates an upcoming colonoscopy (no date set yet)., will keep clinic updated. Patient to resume baseline green intake. Will continue current dose and recheck in 1 week  .  He was instructed to continue current weekly dose as indicated in the maintenance plan below.  Next INR assessment due 1 week. This information was routed to the covering provider for review.Anticoagulation Summary  As of 09/14/2023  INR goal:  2.0-3.0 TTR:  79.9 % (1.1 y) INR used for dosing:  2.2 (09/14/2023) Warfarin maintenance plan:  7.5 mg (7.5 mg x 1) every day Weekly warfarin total:  52.5 mg Plan last modified:  [name], [facility] (09/07/2023) Next INR check:  09/21/2023 Priority:  Maintenance Target end date:  Indefinite  Indications  Deep vein thrombosis (DVT) of calf muscle vein of right lower extremityunspecified chronicity (HC Code) [I82.461]   Anticoagulation Episode Summary   INR check location:  POCT (Point of Care Test)  Preferred lab:  [facility]  Send INR reminders to:  [facility]ARM[facility] OUTPT INR POOL  Comments:    Anticoagulation Care Providers   Provider Role Specialty Phone number  Lenetta Quaker, DO Referring Internal Medicine [phone]  Raelyn Mora, MD Responsible Cardiovascular Dis [phone]  All patient's medications have been reviewed and updated as needed.  I spent 15 min on direct patient education and consulting.The following education was provided for  : :Dosing instructions and Dietary considerations while taking warfarin.Electronically Signed by [name], RPH, October 24, 2024[facility]  [address]  [place]  Phone: [phone]  Fax: [phone]

## 2023-09-21 ENCOUNTER — Encounter: Admit: 2023-09-21 | Payer: PRIVATE HEALTH INSURANCE | Attending: Pharmacist | Primary: Internal Medicine

## 2023-09-21 ENCOUNTER — Ambulatory Visit: Admit: 2023-09-21 | Payer: BLUE CROSS/BLUE SHIELD | Primary: Internal Medicine

## 2023-09-21 DIAGNOSIS — I82461 Acute embolism and thrombosis of right calf muscular vein: Secondary | ICD-10-CM

## 2023-09-21 DIAGNOSIS — E119 Type 2 diabetes mellitus without complications: Secondary | ICD-10-CM

## 2023-09-21 DIAGNOSIS — I10 Essential (primary) hypertension: Secondary | ICD-10-CM

## 2023-09-21 DIAGNOSIS — I82409 Acute embolism and thrombosis of unspecified deep veins of unspecified lower extremity: Secondary | ICD-10-CM

## 2023-09-21 NOTE — Progress Notes
 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  He presents to the clinic today, 09/21/2023, for routine INR monitoring. Patient's INR findings and prescribed dose from last visit:  09/07/2023   9:30 AM 09/14/2023   9:30 AM 09/21/2023   9:00 AM Anticoagulation Monitoring Assoc. INR Date 09/07/2023 09/14/2023 09/21/2023 Associated INR 2.1 2.2 1.2 Instructions 10/19: 7.5 mg; 10/23: 7.5 mg; Otherwise 7.5 mg every day 7.5 mg every day 10/31: 11.25 mg; 11/1: 11.25 mg; 11/2: 11.25 mg; 11/3: 11.25 mg; 11/4: 11.25 mg; Otherwise 7.5 mg every day Sunday Dose 7.5 mg 7.5 mg 11.25 mg (11/3) Monday Dose 7.5 mg 7.5 mg - Tuesday Dose 7.5 mg 7.5 mg - Wednesday Dose 7.5 mg (10/23) 7.5 mg - Thursday Dose 7.5 mg 7.5 mg 11.25 mg (10/31) Friday Dose 7.5 mg 7.5 mg 11.25 mg (11/1) Saturday Dose 7.5 mg (10/19) 7.5 mg 11.25 mg (11/2) Last 7 day dose total 45 mg 52.5 mg 52.5 mg Next INR Date 09/14/2023 09/21/2023 09/25/2023 The following findings were reported from today's visit (no findings if none selected):[]  Missed doses []  Extra doses []  Change in medications []  Change in vitamin k rich food intake (green vegetables, herbals, etc) [x]  Change in alcohol use []  Change in overall health []  Recent hospitalization / emergency department visit []  Upcoming procedure (including dental procedures) [x]  Other Comments:       Decrease in alcohol intake, increase in activity The following side effects were reported from today's visit (no side effects if none selected):[]  Bleeding []  New bruising []  Warfarin-emergency department visit or hospital admission []  Other Comments:   No side effects Assessment/Plan:Patient's INR is 1.2, which is below goal range of 2.0-3.0.   Patient reports no alcohol this week, which is a decrease from normal (decrease in alcohol can decrease INR). Patient reports an increase in activity (can decrease INR).  Discussed possibly starting enoxaparin for subtherapeutic INR, if instructed to do so by Dr Tollie Pizza, Clinic Medical Director. Patient stated he refused to inject enoxaparin despite his risk of clotting/stroke.  Patient educated on risk of clotting/stroke sx. Patient instructed to monitor for clotting/stroke sx, and to go to the ED should any arise. Patient instructed to avoid all sources of vitamin K intake. Patient agreed, stated he understood. Patient takes his warfarin in the morning, has not yet taken today. Patient will take warfarin when he gets home from this appointment.Spoke with Dr Tollie Pizza,  Clinic Medical Director. Dr Tollie Pizza informed that patient stated he refused start enoxaparin injections despite his risk of clotting/stroke. Dr Tollie Pizza gave instructions for patient to take warfarin 11.25mg  daily and recheck on Monday.  He was instructed to  take  as indicated in the maintenance plan below.  Next INR assessment due  on Monday . This information was routed to the covering provider for review.Consult to Dr. Raelyn Mora, MD, clinic medical director, for management of INR=1.2, warfarin dosing instructions per Dr Tollie Pizza, as previously noted, for patient who stated he would refuse to inject enoxaparin if instructed to do so.Anticoagulation Summary  As of 09/21/2023  INR goal:  2.0-3.0 TTR:  78.9 % (1.1 y) INR used for dosing:  1.2 (09/21/2023) Warfarin maintenance plan:  7.5 mg (7.5 mg x 1) every day Weekly warfarin total:  52.5 mg Plan last modified:  Andreka Stucki,  Macky Lower, [facility] (09/07/2023) Next INR check:  09/25/2023 Priority:  Maintenance Target end date:  Indefinite  Indications  Deep vein thrombosis (DVT) of calf muscle vein of right lower extremityunspecified chronicity (HC Code) [I82.461]   Anticoagulation Episode Summary   INR check location:  POCT (Point of Care Test)  Preferred lab:  [facility]  Send INR reminders to:  [facility] OUTPT INR POOL  Comments:    Anticoagulation Care Providers   Provider Role Specialty Phone number  Lenetta Quaker, DO Referring Internal Medicine [phone]  Raelyn Mora, MD Responsible Cardiovascular Dis [phone]  All patient's medications have been reviewed and updated as needed.  I spent 15 min on direct patient education and consulting.The following education was provided for reeducation for out of range [id number] instructions, Blood tests and importance of follow-up monitoring, Signs/symptoms of clot or stroke, Dietary considerations while taking warfarin, Safety considerations while taking warfarin, and activity and alcohol considerations while taking warfarin .Electronically Signed by [name], RPH, October 31, 2024[facility]  [address]  [place]  Phone: [phone]  Fax: [phone]

## 2023-09-25 ENCOUNTER — Encounter: Admit: 2023-09-25 | Payer: PRIVATE HEALTH INSURANCE | Primary: Internal Medicine

## 2023-09-25 ENCOUNTER — Ambulatory Visit: Admit: 2023-09-25 | Payer: BLUE CROSS/BLUE SHIELD | Primary: Internal Medicine

## 2023-09-25 DIAGNOSIS — I82409 Acute embolism and thrombosis of unspecified deep veins of unspecified lower extremity: Secondary | ICD-10-CM

## 2023-09-25 DIAGNOSIS — E119 Type 2 diabetes mellitus without complications: Secondary | ICD-10-CM

## 2023-09-25 DIAGNOSIS — I10 Essential (primary) hypertension: Secondary | ICD-10-CM

## 2023-09-25 DIAGNOSIS — I82461 Acute embolism and thrombosis of right calf muscular vein: Principal | ICD-10-CM

## 2023-09-25 NOTE — Progress Notes
 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  He presents to the clinic today, 09/25/2023, for routine INR monitoring. Patient's INR findings and prescribed dose from last visit:  09/14/2023   9:30 AM 09/21/2023   9:00 AM 09/25/2023   1:00 PM Anticoagulation Monitoring Assoc. INR Date 09/14/2023 09/21/2023 09/25/2023 Associated INR 2.2 1.2 5.0 Instructions 7.5 mg every day 10/31: 11.25 mg; 11/1: 11.25 mg; 11/2: 11.25 mg; 11/3: 11.25 mg; 11/4: 11.25 mg; Otherwise 7.5 mg every day 11/4: 11.25 mg; 11/5: Hold; 11/6: Hold; Otherwise 7.5 mg every day Sunday Dose 7.5 mg 11.25 mg (11/3) - Monday Dose 7.5 mg - 11.25 mg (11/4) Tuesday Dose 7.5 mg - Hold (11/5) Wednesday Dose 7.5 mg - Hold (11/6) Thursday Dose 7.5 mg 11.25 mg (10/31) - Friday Dose 7.5 mg 11.25 mg (11/1) - Saturday Dose 7.5 mg 11.25 mg (11/2) - Last 7 day dose total 52.5 mg 52.5 mg 67.5 mg Next INR Date 09/21/2023 09/25/2023 09/28/2023 The following findings were reported from today's visit (no findings if none selected):[]  Missed doses []  Extra doses []  Change in medications [x]  Change in vitamin k rich food intake (green vegetables, herbals, etc) []  Change in alcohol use []  Change in overall health []  Recent hospitalization / emergency department visit [x]  Upcoming procedure (including dental procedures) []  Other Comments:       Less greens, colonoscopy remains unscheduled The following side effects were reported from today's visit (no side effects if none selected):[]  Bleeding []  New bruising []  Warfarin-emergency department visit or hospital admission []  Other Comments:   No side effects Assessment/Plan:Patient's INR is 5.0, which is above goal range of 2.0-3.0.  Andrew [name] presents to the Anticoagulation Clinic today reporting that he had a decrease in greens (can increase INR). Upcoming colonoscopy, not booked yet. Last week his warfarin dose was increased 150% per medical director for a subtherapeutic INR of 1.2. INR rose by 3.8. He reports everything else has been stable and consistent (no s/s of bleed/clot, but will seek medical attention for any that may arise). Patient was counseled on signs and symptoms of abnormal bleeding, signs of stroke, and to seek medical attention if symptoms arise. Patient already took today's warfarin dose as he takes his dose in the morning. He was instructed to hold x 2 dose(s) of warfarin (per protocol) as indicated in the maintenance plan below. He was instructed to not take a warfarin dose the morning of his next INR.  Next INR assessment due in 2 days per protocol. This information was routed to the covering provider for review.Consult to Dr. Raelyn Mora, MD, clinic medical director, for management of INR 5.0 - by protocol hold warfarin x2 days and recheck INR. Since INR was not >5, did not send patient for confirmation venipuncture. Patient was not experiencing any bleeding and is not a high bleed risk patient. Anticoagulation Summary  As of 09/25/2023  INR goal:  2.0-3.0 TTR:  78.4 % (1.1 y) INR used for dosing:  5.0 (09/25/2023) Warfarin maintenance plan:  7.5 mg (7.5 mg x 1) every day Weekly warfarin total:  52.5 mg Plan last modified:  [name], [facility] (09/07/2023) Next INR check:  09/28/2023 Priority:  Maintenance Target end date:  Indefinite  Indications  Deep vein thrombosis (DVT) of calf muscle vein  of right lower extremityunspecified chronicity (HC Code) [I82.461]   Anticoagulation Episode Summary   INR check location:  POCT (Point of Care Test)  Preferred lab:  [facility]  Send INR reminders to:  [facility]ARMAC[facility] OUTPT INR POOL  Comments:    Anticoagulation Care Providers   Provider Role Specialty Phone number  Lenetta Quaker, DO Referring  [phone]  Raelyn Mora, MD Responsible Cardiovascular Dis [phone]  All patient's medications have been reviewed and updated as needed.  I spent 15 min on direct patient education and consulting.The following education was provided for reeducation for out of range VHQ:IONGEX instructions, Administration considerations (e.g., missed dose management, consistent dosing time), Signs/symptoms of bleeding or bruising and management, Side effects of warfarin, Dietary considerations while taking warfarin, and Safety considerations while taking warfarin.Electronically Signed by Delbert Harness, PharmD, November 4, 2024[facility]  [address]  [place]  Phone: [phone]  Fax: [phone]

## 2023-09-27 ENCOUNTER — Encounter: Admit: 2023-09-27 | Payer: PRIVATE HEALTH INSURANCE | Primary: Internal Medicine

## 2023-09-27 ENCOUNTER — Emergency Department: Admit: 2023-09-27 | Payer: BLUE CROSS/BLUE SHIELD | Primary: Internal Medicine

## 2023-09-27 ENCOUNTER — Emergency Department: Admit: 2023-09-27 | Payer: MEDICARE | Primary: Internal Medicine

## 2023-09-27 ENCOUNTER — Inpatient Hospital Stay: Admit: 2023-09-27 | Payer: MEDICARE

## 2023-09-27 ENCOUNTER — Inpatient Hospital Stay
Admit: 2023-09-27 | Discharge: 2023-09-30 | Payer: BLUE CROSS/BLUE SHIELD | Attending: Internal Medicine | Admitting: Internal Medicine

## 2023-09-27 DIAGNOSIS — I10 Essential (primary) hypertension: Secondary | ICD-10-CM

## 2023-09-27 DIAGNOSIS — I82409 Acute embolism and thrombosis of unspecified deep veins of unspecified lower extremity: Secondary | ICD-10-CM

## 2023-09-27 DIAGNOSIS — I2699 Other pulmonary embolism without acute cor pulmonale: Secondary | ICD-10-CM

## 2023-09-27 DIAGNOSIS — E119 Type 2 diabetes mellitus without complications: Secondary | ICD-10-CM

## 2023-09-27 LAB — CBC WITH AUTO DIFFERENTIAL
BKR WAM ABSOLUTE IMMATURE GRANULOCYTES.: 0.01 x 1000/ÂµL (ref 0.00–0.30)
BKR WAM ABSOLUTE LYMPHOCYTE COUNT.: 1.31 x 1000/ÂµL (ref 0.60–3.70)
BKR WAM ABSOLUTE NRBC (2 DEC): 0 x 1000/ÂµL (ref 0.00–1.00)
BKR WAM ANC (ABSOLUTE NEUTROPHIL COUNT): 2.13 x 1000/ÂµL (ref 2.00–7.60)
BKR WAM BASOPHIL ABSOLUTE COUNT.: 0.04 x 1000/ÂµL (ref 0.00–1.00)
BKR WAM BASOPHILS: 1 % (ref 0.0–1.4)
BKR WAM EOSINOPHIL ABSOLUTE COUNT.: 0.1 x 1000/ÂµL (ref 0.00–1.00)
BKR WAM EOSINOPHILS: 2.4 % (ref 0.0–5.0)
BKR WAM HEMATOCRIT (2 DEC): 51.2 % — ABNORMAL HIGH (ref 38.50–50.00)
BKR WAM HEMOGLOBIN: 17.2 g/dL — ABNORMAL HIGH (ref 13.2–17.1)
BKR WAM IMMATURE GRANULOCYTES: 0.2 % (ref 0.0–1.0)
BKR WAM LYMPHOCYTES: 31.3 % (ref 17.0–50.0)
BKR WAM MCH (PG): 30.6 pg (ref 27.0–33.0)
BKR WAM MCHC: 33.6 g/dL (ref 31.0–36.0)
BKR WAM MCV: 91.1 fL (ref 80.0–100.0)
BKR WAM MONOCYTE ABSOLUTE COUNT.: 0.59 x 1000/ÂµL (ref 0.00–1.00)
BKR WAM MONOCYTES: 14.1 % — ABNORMAL HIGH (ref 4.0–12.0)
BKR WAM MPV: 9.7 fL (ref 8.0–12.0)
BKR WAM NEUTROPHILS: 51 % (ref 39.0–72.0)
BKR WAM NUCLEATED RED BLOOD CELLS: 0 % (ref 0.0–1.0)
BKR WAM PLATELETS: 162 x1000/ÂµL (ref 150–420)
BKR WAM RDW-CV: 14.3 % (ref 11.0–15.0)
BKR WAM RED BLOOD CELL COUNT.: 5.62 M/ÂµL (ref 4.00–6.00)
BKR WAM WHITE BLOOD CELL COUNT: 4.2 x1000/ÂµL (ref 4.0–11.0)

## 2023-09-27 LAB — TROPONIN T HIGH SENSITIVITY, 1 HOUR WITH REFLEX (BH GH LMW YH)
BKR TROPONIN T HS 1 HOUR DELTA FROM 0 HOUR: -1 ng/L
BKR TROPONIN T HS 1 HOUR: 11 ng/L

## 2023-09-27 LAB — COMPREHENSIVE METABOLIC PANEL
BKR ALANINE AMINOTRANSFERASE (ALT): 56 U/L (ref 12–78)
BKR ALBUMIN: 3.7 g/dL (ref 3.4–5.0)
BKR ALKALINE PHOSPHATASE: 49 U/L (ref 45–117)
BKR ANION GAP (LM): 6 mmol/L (ref 5–15)
BKR ASPARTATE AMINOTRANSFERASE (AST): 45 U/L — ABNORMAL HIGH (ref 15–37)
BKR BILIRUBIN TOTAL: 0.7 mg/dL (ref 0.2–1.0)
BKR BLOOD UREA NITROGEN: 13 mg/dL (ref 7–18)
BKR CALCIUM: 9 mg/dL (ref 8.5–10.1)
BKR CHLORIDE: 106 mmol/L (ref 98–107)
BKR CO2: 24 mmol/L (ref 21–32)
BKR CREATININE: 1.06 mg/dL (ref 0.70–1.30)
BKR EGFR, CREATININE (CKD-EPI 2021): >60 mL/min/{1.73_m2} (ref >=60)
BKR GLOBULIN: 3.8 g/dL (ref 2.5–5.0)
BKR GLUCOSE: 190 mg/dL — ABNORMAL HIGH (ref 65–110)
BKR POTASSIUM: 4.4 mmol/L (ref 3.5–5.1)
BKR PROTEIN TOTAL: 7.5 g/dL (ref 6.4–8.2)
BKR SODIUM: 136 mmol/L (ref 136–145)

## 2023-09-27 LAB — LACTIC ACID, PLASMA (REFLEX 2H REPEAT): BKR LACTATE: 1.2 mmol/L (ref 0.4–2.0)

## 2023-09-27 LAB — URINALYSIS WITH CULTURE REFLEX      (BH LMW YH)
BKR BILIRUBIN, UA: NEGATIVE
BKR BLOOD, UA: NEGATIVE
BKR KETONES, UA: NEGATIVE
BKR LEUKOCYTE ESTERASE, UA: NEGATIVE
BKR NITRITE, UA: NEGATIVE
BKR PH, UA: 6 (ref 5.5–7.5)
BKR PROTEIN, UA: NEGATIVE
BKR SPECIFIC GRAVITY, UA: 1.037 — ABNORMAL HIGH (ref 1.005–1.030)
BKR UROBILINOGEN, UA (MG/DL): <2 mg/dL (ref <=2.0)

## 2023-09-27 LAB — SARS-COV-2 (COVID-19)/INFLUENZA A+B/RSV BY RT-PCR (BH GH LMW YH)
BKR INFLUENZA A: NEGATIVE
BKR INFLUENZA B: NEGATIVE
BKR RESPIRATORY SYNCYTIAL VIRUS: NEGATIVE
BKR SARS-COV-2 RNA (COVID-19) (YH): NEGATIVE

## 2023-09-27 LAB — TROPONIN T HIGH SENSITIVITY, 0 HOUR BASELINE WITH REFLEX (BH GH LMW YH): BKR TROPONIN T HS 0 HOUR BASELINE: 12 ng/L — ABNORMAL HIGH

## 2023-09-27 LAB — PROTIME AND INR
BKR INR: 3.45 — ABNORMAL HIGH (ref 0.89–1.18)
BKR PROTHROMBIN TIME: 32.8 s — ABNORMAL HIGH (ref 9.3–11.9)

## 2023-09-27 LAB — BABESIA SMEAR (BH GH LMW YH): BKR BABESIA SMEAR: NEGATIVE

## 2023-09-27 MED ORDER — ONDANSETRON HCL (PF) 4 MG/2 ML INJECTION SOLUTION
4 mg/2 mL | Freq: Four times a day (QID) | INTRAVENOUS | Status: AC | PRN
Start: 2023-09-27 — End: 2023-10-01

## 2023-09-27 MED ORDER — PIPERACILLIN-TAZOBACTAM 4.5 GRAM/100 ML DEXTROSE(ISO-OSM) IV PIGGYBACK
4.5100 gram/100 mL | Freq: Four times a day (QID) | INTRAVENOUS | Status: DC
Start: 2023-09-27 — End: 2023-09-27

## 2023-09-27 MED ORDER — LISINOPRIL 10 MG TABLET
10 mg | Freq: Every day | ORAL | Status: CP
Start: 2023-09-27 — End: 2023-10-01
  Administered 2023-09-28 – 2023-09-30 (×3): 10 mg via ORAL

## 2023-09-27 MED ORDER — SODIUM CHLORIDE 0.9 % LARGE VOLUME SYRINGE FOR AUTOINJECTOR
Freq: Once | INTRAVENOUS | Status: CP | PRN
Start: 2023-09-27 — End: ?
  Administered 2023-09-27: 18:00:00 via INTRAVENOUS

## 2023-09-27 MED ORDER — FRUIT JUICE
ORAL | Status: AC | PRN
Start: 2023-09-27 — End: 2023-10-01

## 2023-09-27 MED ORDER — EMPAGLIFLOZIN 10 MG TABLET
10 mg | Freq: Every day | ORAL | Status: CP
Start: 2023-09-27 — End: 2023-10-01
  Administered 2023-09-28 – 2023-09-30 (×3): 10 mg via ORAL

## 2023-09-27 MED ORDER — SODIUM CHLORIDE 0.9 % (FLUSH) INJECTION SYRINGE
0.9 % | Freq: Three times a day (TID) | INTRAVENOUS | Status: AC
Start: 2023-09-27 — End: 2023-10-01
  Administered 2023-09-28 – 2023-09-30 (×3): 0.9 mL via INTRAVENOUS

## 2023-09-27 MED ORDER — SODIUM CHLORIDE 0.9 % (FLUSH) INJECTION SYRINGE
0.9 % | INTRAVENOUS | Status: AC | PRN
Start: 2023-09-27 — End: 2023-10-01

## 2023-09-27 MED ORDER — PIPERACILLIN-TAZOBACTAM 4.5 GRAM/100 ML DEXTROSE(ISO-OSM) IV PIGGYBACK
4.5 | Freq: Once | INTRAVENOUS | Status: CP
Start: 2023-09-27 — End: ?
  Administered 2023-09-27: 20:00:00 4.5 mL/h via INTRAVENOUS

## 2023-09-27 MED ORDER — DEXTROSE 15 GRAM/60 ML ORAL LIQUID
1560 gram/60 mL | ORAL | Status: AC | PRN
Start: 2023-09-27 — End: 2023-10-01

## 2023-09-27 MED ORDER — INSULIN LISPRO 100 UNIT/ML (CORRECTION SCALE)
100 unit/mL | Freq: Three times a day (TID) | SUBCUTANEOUS | Status: CP
Start: 2023-09-27 — End: 2023-10-01
  Administered 2023-09-28 – 2023-09-30 (×3): 100 unit/mL via SUBCUTANEOUS

## 2023-09-27 MED ORDER — POLYETHYLENE GLYCOL 3350 17 GRAM ORAL POWDER PACKET
17 | Freq: Two times a day (BID) | ORAL | Status: CP
Start: 2023-09-27 — End: ?
  Administered 2023-09-27 – 2023-09-28 (×2): 17 gram via ORAL

## 2023-09-27 MED ORDER — AMLODIPINE 5 MG TABLET
5 | ORAL | Status: DC
Start: 2023-09-27 — End: 2023-09-28
  Administered 2023-09-28: 13:00:00 5 mg via ORAL

## 2023-09-27 MED ORDER — FLU VACC 2024-25(65YR UP)-MF59C(PF) 45 MCG(15 MCGX3)/0.5 ML IM SYRINGE
45 | INTRAMUSCULAR | Status: CP
Start: 2023-09-27 — End: ?
  Administered 2023-09-28: 13:00:00 45 mL via INTRAMUSCULAR

## 2023-09-27 MED ORDER — DEXTROSE 10 % IV BOLUS FOR ORDERABLE
INTRAVENOUS | Status: AC | PRN
Start: 2023-09-27 — End: 2023-10-01

## 2023-09-27 MED ORDER — GLUCAGON 1 MG/ML IN STERILE WATER
Freq: Once | INTRAMUSCULAR | Status: AC | PRN
Start: 2023-09-27 — End: 2023-10-01

## 2023-09-27 MED ORDER — POLYETHYLENE GLYCOL 3350 17 GRAM ORAL POWDER PACKET
17 gram | Freq: Two times a day (BID) | ORAL | Status: DC
Start: 2023-09-27 — End: 2023-09-27

## 2023-09-27 MED ORDER — ONDANSETRON 4 MG DISINTEGRATING TABLET
4 mg | Freq: Four times a day (QID) | ORAL | Status: AC | PRN
Start: 2023-09-27 — End: 2023-10-01

## 2023-09-27 MED ORDER — PIPERACILLIN-TAZOBACTAM 4.5 GRAM/100 ML DEXTROSE(ISO-OSM) IV PIGGYBACK
4.5100 gram/100 mL | Freq: Four times a day (QID) | INTRAVENOUS | Status: AC
Start: 2023-09-27 — End: 2023-09-30
  Administered 2023-09-28 – 2023-09-30 (×11): 4.5 mL/h via INTRAVENOUS

## 2023-09-27 MED ORDER — ACETAMINOPHEN 325 MG TABLET
325 mg | Freq: Four times a day (QID) | ORAL | Status: AC | PRN
Start: 2023-09-27 — End: 2023-10-01

## 2023-09-27 MED ORDER — IOHEXOL 350 MG IODINE/ML INTRAVENOUS SOLUTION
350 | Freq: Once | INTRAVENOUS | Status: CP | PRN
Start: 2023-09-27 — End: ?
  Administered 2023-09-27: 18:00:00 350 mL via INTRAVENOUS

## 2023-09-27 MED ORDER — SKIM MILK
ORAL | Status: AC | PRN
Start: 2023-09-27 — End: 2023-10-01

## 2023-09-27 NOTE — Utilization Review (ED)
 UM Status: Commercial - IP, determined inpatient.  Possible infectious process.  ID consult planned, tx with IV zosyn.

## 2023-09-27 NOTE — ED Provider Notes
 Chief Complaint Patient presents with  Weakness   Pt arrives via triage from home with complaints of weakness for the past two days. Pt states, I feel as if I am going to melt intot the groun. Pt states starting with sweats and chills on Monday. Pt denies sick contacts. Pt took a negative covid test on Sunday. Warafrin INR was off the charts.  Pt is on thinners for DVT prevention HPI/PE:67 year old male history of diabetes, previous DVT on warfarin presents to the emergency department for a few days of chills, sweats, and generalized weakness.  No known sick contacts.  Denies nausea vomiting diarrhea abdominal pain chest pain cough, neck pain or headache.  Denies recent travel.  He reports his INRs are running high and held his warfarin last 2 days.ZOX:WRUEAVW arrives to the emergency department drenched in sweat.  He is afebrile.  Vitals are hemodynamically stable.  EKG independently interpreted by me demonstrating no STEMI.  Patient appears to have new onset atrial fibrillation.  Rate controlled in the 90s. His INR is 3.45.  He denies chest pain.  Initial troponin 12, 2nd troponin 11.  Low suspicion for ACS.He has no leukocytosis.  UA negative for UTI.  Chest x-ray negative for consolidation.  He was COVID, flu, RSV negative.  He has no neck pain and is freely moving his neck.  Low suspicion for meningitis.  No headache reported.He reports last week he was cutting wood with his friend in the woods and developed an episode of dizziness.  He denies known tick exposure.  I have ordered Lyme and tick studies. While in the ED, patient had an transient episode of dizziness while going to [place]. [place] head negative for acute findings. No focal neurologic deficits on exam. Low suspicion for CVA. Patient has known hx of DVT of left leg for decades. CTA chest found to have small pulmonary emboli. No reported heart strain. Vitals hemodynamically stable. Patient INR is 3.45. Consider septic emboli in differential.  Blood cultures obtained.  Patient was started empiric Zosyn.I have discussed the case with hospitalist Dr. Andrey Campanile who has kindly agreed to admit the patient for further evaluation. An acute or life threatening problem was considered during this evaluation  Independent interpretation of: ECGDirectly spoke with:  Hospitalist  Physical ExamED Triage Vitals [09/27/23 0953]BP: 120/73Pulse: (!) 91Pulse from  O2 sat: n/aResp: 19Temp: 97.7 ?F (36.5 ?C)Temp src: OralSpO2: 97 % BP 120/73  - Pulse (!) 91  - Temp 97.7 ?F (36.5 ?C) (Oral)  - Resp 19  - Ht 6' 1 (1.854 m)  - Wt 87.3 kg  - SpO2 97%  - BMI 25.39 kg/m? Physical ExamVitals and nursing note reviewed. Constitutional:     Appearance: He is not toxic-appearing. HENT:    Head: Normocephalic and atraumatic.    Mouth/Throat:    Mouth: Mucous membranes are moist. Eyes:    Extraocular Movements: Extraocular movements intact.    Pupils: Pupils are equal, round, and reactive to light. Cardiovascular:    Rate and Rhythm: Normal rate.    Heart sounds: No murmur heard.Pulmonary:    Effort: No respiratory distress.    Breath sounds: No stridor. No wheezing, rhonchi or rales. Abdominal:    General: There is no distension.    Palpations: There is no mass.    Tenderness: There is no abdominal tenderness.    Hernia: No hernia is present. Musculoskeletal:       General: No tenderness or deformity.    Cervical back: Normal range of motion. No  rigidity or tenderness.    Right lower leg: No edema.    Left lower leg: No edema. Skin:   Comments: Sweating Neurological:    General: No focal deficit present.    Mental Status: He is alert. Mental status is at baseline.    Comments: NIH is 0 Psychiatric:       Mood and Affect: Mood normal.       Behavior: Behavior normal.  ProceduresAttestation/Critical CareClinical Impressions as of 09/27/23 1441 Fatigue, unspecified type Pulmonary embolism, unspecified chronicity, unspecified pulmonary embolism type, unspecified whether acute cor pulmonale present (HC Code) (HC CODE) (HC Code) Atrial fibrillation, unspecified type (HC Code) (HC CODE) (HC Code)  ED DispositionNo disposition selected since last refresh of note. Mady Haagensen, DO11/06/24 1504

## 2023-09-27 NOTE — Plan of Care
 Plan of Care Overview/ Patient Status    Admission Note Nursing Andrew [name] is a 67 y.o. male admitted with a chief complaint of pulmonary embolism. Patient arrived from  ED.Assumed care of pt at approximately 1600 upon arrival from ED. Patient is  A+O x4, ambulating independently. Pt instructed to call for assistance if dizziness returns. Pt reporting dyspnea with exertion but remains on room air. Initiated on telemetry maintaining afib rhythm 90-100s, tachy up to 140s with exertion but not sustained. BS monitored ACHS, no SSI administered. Pt denies any pain. Initiated on droplet precautions for respiratory rule out. VSS, safety maintained, call light within reach.  Vitals:  09/27/23 1145 09/27/23 1320 09/27/23 1428 09/27/23 1600 BP: 113/82 (!) 132/94 116/88 96/63 Pulse: 85 (!) 92 (!) 95 78 Resp: 20 17 20 20  Temp:    98.3 ?F (36.8 ?C) TempSrc:    Oral SpO2: 96% 98% 98% 96% Weight:    108.1 kg Height:     Oxygen therapy Oxygen TherapySpO2: 96 %Device (Oxygen Therapy): room airI have reviewed the patient's current medication orders.     Current Facility-Administered Medications Medication Dose Route Frequency Provider Last Rate Last Admin  [START ON 09/28/2023] amLODIPine (NORVASC) tablet 5 mg  5 mg Oral Daily Holland Falling Elly Modena, MD      [START ON 09/28/2023] empagliflozin (JARDIANCE) tablet 10 mg  10 mg Oral Daily Holland Falling Elly Modena, MD      [START ON 09/28/2023] influenza vaccine (PF) [id number] (adjuvanted, > or = 67 yo) injection 0.5 mL  0.5 mL Intramuscular Vaccination x 1 Brice, Yardley, DO      insulin lispro (Admelog, HumaLOG) Correction Scale 1-18 Units  1-18 Units Subcutaneous TID AC Wilson, Mady Haagensen, MD      [START ON 09/28/2023] lisinopriL (PRINIVIL,ZESTRIL) tablet 10 mg  10 mg Oral Daily Wilson, Mina Elly Modena, MD      piperacillin-tazobactam (ZOSYN) 4.5 g in dextrose iso-osmotic 100 mL IVPB  4.5 g Intravenous Q6H Wilson, Mina Elly Modena, MD      polyethylene glycol (MIRALAX) packet 17 g  17 g Oral BID Holland Falling Elly Modena, MD   17 g at 09/27/23 1657  sodium chloride 0.9 % flush 3 mL  3 mL IV Push Q8H Wilson, Mina Elly Modena, MD      acetaminophen, dextrose (GLUCOSE) oral liquid 15 g **OR** fruit juice **OR** skim milk, dextrose (GLUCOSE) oral liquid 30 g **OR** fruit juice, dextrose injection, dextrose injection, glucagon, ondansetron **OR** ondansetron (ZOFRAN) IV Push, sodium chlorideCurrent Facility-Administered Medications Medication Dose Route Frequency Last Rate  acetaminophen  650 mg Oral Q6H PRN    [START ON 09/28/2023] amLODIPine  5 mg Oral Daily    dextrose (GLUCOSE) oral liquid 15 g  15 g Oral Q15 MIN PRN    Or  fruit juice  120 mL Oral Q15 MIN PRN    Or  skim milk  240 mL Oral Q15 MIN PRN    dextrose (GLUCOSE) oral liquid 30 g  30 g Oral Q15 MIN PRN    Or  fruit juice  240 mL Oral Q15 MIN PRN    dextrose injection  12.5 g Intravenous Q15 MIN PRN    dextrose injection  25 g Intravenous Q15 MIN PRN    [START ON 09/28/2023] empagliflozin  10 mg Oral Daily    glucagon  1 mg Intramuscular Once PRN    [START ON 09/28/2023] Influenza vaccine orderable (Age over 6 months)  0.5 mL Intramuscular Vaccination x 1  insulin lispro  1-18 Units Subcutaneous TID AC    [START ON 09/28/2023] lisinopriL  10 mg Oral Daily    ondansetron  4 mg Oral Q6H PRN    Or  ondansetron (ZOFRAN) IV Push  4 mg IV Push Q6H PRN    piperacillin-tazobactam  4.5 g Intravenous Q6H    polyethylene glycol  17 g Oral BID    sodium chloride  3 mL IV Push Q8H    sodium chloride  3 mL IV Push PRN for Line Care   .I have reviewed patient valuables Belongings charted in last 7 days: Patient Valuables   Patient Valuables Flowsheet                    PATIENT VALUABLE(S)       Clothing Disposition At bedside/locker/closet 09/27/23 1611  Vision Disposition At bedside/locker/closet 09/27/23 1611   Cell phone disposition At bedside/locker/closet 09/27/23 1611  Jewelry disposition At bedside/locker/closet 09/27/23 1643   Other disposition At bedside/locker/closet 09/27/23 1611     See flowsheets, patient education and plan of care for additional information. Problem: Adult Inpatient Plan of CareGoal: Optimal Comfort and WellbeingOutcome: Interventions implemented as appropriate Problem: Fall Injury RiskGoal: Absence of Fall and Fall-Related InjuryOutcome: Interventions implemented as appropriate Problem: InfectionGoal: Absence of Infection Signs and SymptomsOutcome: Interventions implemented as appropriate

## 2023-09-27 NOTE — H&P
 Medicine Admission History & Physical ExamProvider: Holland Falling, MDPatient Data:  Patient Name: Andrew [name] Age: 67 y.o. DOB: Jul 13, 1956	 MRN: [id number]	 PCP: Claude Manges Complaint CC: sweating, chillsHPI Andrew [name] is a 67 y.o. 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  The patient noted that he feels off balance over the last 3 days.  He was having trouble doing his usual outdoor activities.  He noticed extreme fatigue when he was cutting firewood with his friend on Sunday. This was associated with unsteady gait, profuse sweating, and chills. He denies any coughing, dysuria, flank pain, abdominal pain, nausea, vomiting, diarrhea.  When asked about skin breaks or wounds, he stated that he had Chiggers for a few weeks but it has resolved. A recently healed venous stasis ulcer was also noted by patient. CTA was significant for bilateral small PEs.  Patient was admitted for further management.ROS: A 14 point review of systems is negative, except for the above Other Histories MEDICATIONS:No current facility-administered medications on file prior to encounter. Current Outpatient Medications on File Prior to Encounter Medication Sig Dispense Refill  amLODIPine (NORVASC) 5 mg tablet Take 1 tablet (5 mg total) by mouth daily.    JANUVIA 100 mg tablet Take 1 tablet (100 mg total) by mouth daily.    JARDIANCE 10 mg tablet Take 1 tablet (10 mg total) by mouth daily.    lisinopriL (PRINIVIL,ZESTRIL) 10 mg tablet Take 1 tablet (10 mg total) by mouth daily.    LORazepam (ATIVAN) 0.5 mg tablet Take 1 tablet (0.5 mg total) by mouth daily as needed.    warfarin (COUMADIN) 7.5 mg tablet Take as directed, up to 1.5 tablets per day. 135 tablet 3  BLOOD GLUCOSE METER device Use to check blood sugar as needed. Diabetes mellitus type 2 [E11.9]. Please dispense glucometer covered by patient's insurance. 1 each 0  blood sugar diagnostic test strips Use to check blood sugar as needed. Diabetes mellitus type 2 [E11.9]. Please dispense supplies covered by patient's insurance. 10 each 3  lancets Use to check blood sugar as needed. Diabetes mellitus type 2 [E11.9]. Please dispense supplies covered by patient's insurance. 50 each 0  tadalafiL (CIALIS) 20 mg tablet Take 1 tablet (20 mg total) by mouth daily as needed.    [DISCONTINUED] omeprazole (PRILOSEC) 40 mg capsule Take 1 capsule (40 mg total) by mouth daily. (Patient not taking: Reported on 09/27/2023)    Allergies:  Allergies Allergen Reactions  Sulfa (Sulfonamide Antibiotics) Hives Medical/Surgical History:Past Medical History: Diagnosis Date  Diabetes mellitus (HC Code)   DVT (deep venous thrombosis) (HC Code) (HC CODE) (HC Code)   Hypertension  No past surgical history on file.Family History:  History reviewed. No pertinent family history. Social History:  Social History Tobacco Use  Smoking status: Never  Physical Exam Physical Exam:Temp:  [97.7 ?F (36.5 ?C)-97.9 ?F (36.6 ?C)] 97.9 ?F (36.6 ?C)Pulse:  [85-95] 95Resp:  [17-21] 20BP: (113-132)/(73-94) 116/88SpO2:  [95 %-98 %] 98 %Physical ExamHENT:    Head: Normocephalic and atraumatic.    Mouth/Throat:    Mouth: Mucous membranes are moist. Eyes:    Conjunctiva/sclera: Conjunctivae normal. Neck:    Vascular: No JVD. Cardiovascular:    Rate and Rhythm: Normal rate and regular rhythm.    Heart sounds: Normal heart sounds. No murmur heard.Pulmonary:    Effort: Pulmonary effort is normal.    Breath sounds: Normal breath sounds. No wheezing or rales. Abdominal:  General: Bowel sounds are normal. There is no distension.    Palpations: Abdomen is soft.    Tenderness: There is no abdominal tenderness. Musculoskeletal: General: Normal range of motion.    Cervical back: Normal range of motion.    Right lower leg: No edema.    Left lower leg: No edema. Skin:   General: Skin is warm and dry. Neurological:    General: No focal deficit present.    Mental Status: He is alert.    Motor: No weakness or abnormal muscle tone.    Coordination: Coordination normal. Labs and imaging Recent Results (from the past 24 hour(s)) Troponin T High Sensitivity, Emergency; 0 hour baseline AND 1 hour with reflex (3 hour)  Collection Time: 09/27/23 10:16 AM Result Value Ref Range  High Sensitivity Troponin T 12 (H) See Comment ng/L Protime and INR (if on Warfarin)  Collection Time: 09/27/23 10:16 AM Result Value Ref Range  Prothrombin Time 32.8 (H) 9.3 - 11.9 seconds  INR 3.45 (H) 0.89 - 1.18 CBC auto differential  Collection Time: 09/27/23 10:16 AM Result Value Ref Range  WBC 4.2 4.0 - 11.0 x1000/?L  RBC 5.62 4.00 - 6.00 M/?L  Hemoglobin 17.2 (H) 13.2 - 17.1 g/dL  Hematocrit 91.47 (H) 82.95 - 50.00 %  MCV 91.1 80.0 - 100.0 fL  MCH 30.6 27.0 - 33.0 pg  MCHC 33.6 31.0 - 36.0 g/dL  RDW-CV 62.1 30.8 - 65.7 %  Platelets 162 150 - 420 x1000/?L  MPV 9.7 8.0 - 12.0 fL  Neutrophils 51.0 39.0 - 72.0 %  Lymphocytes 31.3 17.0 - 50.0 %  Monocytes 14.1 (H) 4.0 - 12.0 %  Eosinophils 2.4 0.0 - 5.0 %  Basophil 1.0 0.0 - 1.4 %  Immature Granulocytes 0.2 0.0 - 1.0 %  nRBC 0.0 0.0 - 1.0 %  Absolute Lymphocyte Count 1.31 0.60 - 3.70 x 1000/?L  Monocyte Absolute Count 0.59 0.00 - 1.00 x 1000/?L  Eosinophil Absolute Count 0.10 0.00 - 1.00 x 1000/?L  Basophil Absolute Count 0.04 0.00 - 1.00 x 1000/?L  Absolute Immature Granulocyte Count 0.01 0.00 - 0.30 x 1000/?L  Absolute nRBC 0.00 0.00 - 1.00 x 1000/?L  ANC (Abs Neutrophil Count) 2.13 2.00 - 7.60 x 1000/?L Comprehensive metabolic panel  Collection Time: 09/27/23 10:16 AM Result Value Ref Range  Sodium 136 136 - 145 mmol/L  Potassium 4.4 3.5 - 5.1 mmol/L  Chloride 106 98 - 107 mmol/L  CO2 24 21 - 32 mmol/L  Anion Gap 6 5 - 15 mmol/L  Glucose 190 (H) 65 - 110 mg/dL  BUN 13 7 - 18 mg/dL  Creatinine 8.46 9.62 - 1.30 mg/dL  Calcium 9.0 8.5 - 95.2 mg/dL  Total Protein 7.5 6.4 - 8.2 g/dL  Albumin 3.7 3.4 - 5.0 g/dL  Globulin 3.8 2.5 - 5.0 g/dL  Total Bilirubin 0.7 0.2 - 1.0 mg/dL  Alkaline Phosphatase 49 45 - 117 U/L  Alanine Aminotransferase (ALT) 56 12 - 78 U/L  Aspartate Aminotransferase (AST) 45 (H) 15 - 37 U/L  eGFR (Creatinine) >60 >=60 mL/min/1.59m2 EKG  Collection Time: 09/27/23 10:19 AM Result Value Ref Range  Heart Rate 97 bpm  QRS Interval 100 ms  QT Interval 355 ms  QTC Interval 451 ms  P Axis 0 deg  QRS Axis -15 deg  T Wave Axis 28 deg  P-R Interval 0 msec  SEVERITY Abnormal ECG severity SARS-CoV-2 (COVID-19)/Influenza A+B/RSV by RT-PCR ([facility])  Collection Time: 09/27/23 10:22 AM  Specimen: Nasopharynx; Viral Result  Value Ref Range  Influenza A Negative Negative  Influenza B Negative Negative  Respiratory Syncytial Virus Negative Negative  SARS-CoV-2 RNA (COVID-19)  Negative Negative Urinalysis with culture reflex     ([facility])  Collection Time: 09/27/23 11:09 AM  Specimen: Clean Catch (Voided); Urine Result Value Ref Range  Clarity, UA Clear Clear  Color, UA Yellow Yellow, Colorless  Specific Gravity, UA 1.037 (H) 1.005 - 1.030  pH, UA 6.0 5.5 - 7.5  Protein, UA Negative Negative, Trace  Glucose, UA 4+ (A) Negative  Ketones, UA Negative Negative  Blood, UA Negative Negative  Bilirubin, UA Negative Negative  Leukocytes, UA Negative Negative  Nitrite, UA Negative Negative  Urobilinogen, UA <2.0 <=2.0 mg/dL Troponin T High Sensitivity, 1 Hour With Reflex ([facility])  Collection Time: 09/27/23  1:28 PM Result Value Ref Range  High Sensitivity Troponin T 11 See Comment ng/L  1 hour Delta from 0 Hour, HS-Troponin T -1 ng/L ECG: Atrial fibrillation:Borderline left axis deviation:Previous EKG not available to compare Imaging:CTA Chest (PE) w IV ContrastResult Date: 11/6/2024CT angiogram of the chest HISTORY: dyspnea, hx DVT TECHNIQUE: Imaging followed pulmonary embolus protocol (thin sections during rapid administration of intravenous contrast). Images were reconstructed on an independent workstation and saved to PACS. CONTRAST: 75 cc Omnipaque 350 COMPARISON: No previous corresponding imaging at [facility]. [id number] - Total number of known [place] scans and cardiac nuclear medicine studies within the past 12 months: 2 FINDINGS: The main pulmonary arteries are clear. There are small filling defects seen in the right descending pulmonary artery, consistent with small volume of pulmonary emboli. The ascending aorta, aortic arch, and descending thoracic aorta are normal in size. There is cardiac enlargement and there are coronary artery calcification. No pericardial effusion is seen. The lungs are clear of infiltrates. No pulmonary nodule or pleural effusion is seen. There is no pathologic axillary, mediastinal, or hilar adenopathy. The upper abdomen demonstrates a small hiatal hernia. Osseous structures are intact. There is a small volume of pulmonary emboli. Otherwise, no acute pulmonary process is seen.  Results were discussed with Dr. Lady Gary at 2:10 PM. [facility] Radiology Notification System Classification: Routine. Estimated radiation dose: 413.72 mGy-cm Radiation dose lowering was achieved by the use of an iterative reconstruction technique. Reported and signed by:  Renee Harder, MD [place] Head wo IV ContrastResult Date: 11/6/2024CT HEAD WO IV CONTRAST  HISTORY: Headache, new or worsening (Age >= 50y) headache. COMPARISON: None. U0454 - Total number of known [place] scans and cardiac nuclear medicine studies within the past 12 months: 2 U9811 - RADIATION DOSE ACQUIRED DURING SCAN: 866.67 mGy.cm. The [employer] strives for high quality imaging with the lowest possible radiation dose. For more information on medical radiation exposure please visit: [email or web address] . TECHNIQUE: Axial sections were obtained from the skull base to the vertex without the administration of intravenous contrast.  Coronal and sagittal reformatted images were provided.  FINDINGS: There is generalized atrophy. No intracranial hemorrhage is seen. No extra-axial fluid collection is seen.  There is nonspecific white matter disease. Bilateral basal drain catheter calcifications are present.  The ventricles are normal in size and are midline. The visualized paranasal sinuses are clear.    The mastoid air cells are clear.  No gross intraorbital abnormality is seen.    No acute intracranial abnormality is seen. [facility] Radiology Notification System Classification: Routine. [id number] Reported and signed by:  Renee Harder, MD XR Chest PA or AP-use if patient is unstableResult Date: 11/6/2024Chest  HISTORY: Chest Pain IMAGING: AP view of the chest at approximately 10:38. COMPARISON: None FINDINGS: The lungs are clear. The heart and mediastinum are within normal limits. Osseous structures appear intact. No acute process is seen. [facility] Radiology Notification System Classification: Routine. Reported and signed by:  Renee Harder, MD   Assessment/Plan Jashun Puertas is a 67 y.o. 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 Will need [place] A/P but unable to obtain today given administration of IV contrast.  We will plan on doing that tomorrow.  In the meantime, obtain x-ray abdomen AP and lateral to rule out abdominal perforationAddendumNo perforation. Constipation noted. Miralax BID ordered. Abx as below.- Lyme, anaplasma, Ehrlichia and Babesia testing- follow up on blood cultures- resp viral panel- zosyn for now- ID consultNew onset Afib- patient already on coumadin- consult cardiology DM- SSI Addendum- INR was subtherapeutic on 09/21/23. This could be a window for blood clot formation. No need to start lovenox currently. Will consider DOAC when INR comes down. Disucussed wth Dr Rico Junker Diet: Diet Clear LiquidVTE prophylaxis: supratherapeutic INRCode status: Full CodeI confirmed that the patient?s Advance Care Plan is present, code status is documented, or surrogate decision maker is listed in the patient?s medical record.I have utilized all available immediate resources to obtain, update, or review the patient?s current medications.Care/plan discussed with patient at the bedside, who understands and agrees with the above plan.Signed: , MDBeeper: [phone]/6/202415:[id number] report was created in part from templates and voice recognition software. Typographical and minor dictation errors may be present.

## 2023-09-27 NOTE — Other
 PHARMACY-ASSISTED MEDICATION REPORTPharmacist review of the best possible medication history obtained by the pharmacy medication history technician has been performed.  I have updated the home medication list and identified the following information that may be relevant to this admission.NOTES/RECOMMENDATIONSMedication list was updated to best possible statePatient provided good medication history including exact dose/frequency during interviewFollowing resources were utilized to confirm the list Patient interview  and Pharmacy record - Epic Surescripts Database [place], CPHT11/06/243:19 PM        Prior to Admission Medications Medication Name Sig Taking? Patient Reported   amLODIPine (NORVASC) 5 mg tabletLast dose: 09/27/2023 Take 1 tablet (5 mg total) by mouth daily. Yes Yes   BLOOD GLUCOSE METER deviceLast dose:  --  Use to check blood sugar as needed. Diabetes mellitus type 2 [E11.9]. Please dispense glucometer covered by patient's insurance.       blood sugar diagnostic test stripsLast dose:  --  Use to check blood sugar as needed. Diabetes mellitus type 2 [E11.9]. Please dispense supplies covered by patient's insurance.       JANUVIA 100 mg tabletLast dose: 09/27/2023 Take 1 tablet (100 mg total) by mouth daily. Yes Yes   JARDIANCE 10 mg tabletLast dose: 09/27/2023 Take 1 tablet (10 mg total) by mouth daily. Yes Yes   lancetsLast dose:  --  Use to check blood sugar as needed. Diabetes mellitus type 2 [E11.9]. Please dispense supplies covered by patient's insurance.       lisinopriL (PRINIVIL,ZESTRIL) 10 mg tabletLast dose: 09/27/2023 Take 1 tablet (10 mg total) by mouth daily. Yes Yes   LORazepam (ATIVAN) 0.5 mg tabletLast dose:  -- Last Medication Note: >> Lorrene Reid Sep 27, 2023  3:17 PMMedHX Tech(Colleen Katrinka Blazing, CPHT):  Patient has some left over from an old prescription, hasn't taken in awhile   Entered by Prudy Feeler, CPHT Wed Sep 27, 2023 1517 Take 1 tablet (0.5 mg total) by mouth daily as needed. Yes Yes   Discontinued: 09/27/2023  3:19 PMLast dose: Not Taking   Yes   tadalafiL (CIALIS) 20 mg tabletLast dose:  --  Take 1 tablet (20 mg total) by mouth daily as needed.   Yes   warfarin (COUMADIN) 7.5 mg tabletLast dose:  --  Take as directed, up to 1.5 tablets per day. Yes     Prior to admission medications last reviewed by Sherol Dade, [facility] on Wed Sep 27, 2023 1520 Thank Billy Coast, RPH11/6/20243:20 PMPhone: [phone]

## 2023-09-27 NOTE — ED Notes
 Pt reporting that when standing to go to [place] had episode of dizziness. Pt then recalled have several near syncopal episodes in the woods when chopping wood. Provider aware.

## 2023-09-27 NOTE — ED Notes
 [name] Handoff Report: Andrew [name] presented to the emergency department seeking treatment for Weakness (Pt arrives via triage from home with complaints of weakness for the past two days. Pt states, I feel as if I am going to melt intot the groun. Pt states starting with sweats and chills on Monday. Pt denies sick contacts. Pt took a negative covid test on Sunday. [place] INR was off the charts.  Pt is on thinners for DVT prevention)Andrew [name]  has a past medical history of Diabetes mellitus (HC Code), DVT (deep venous thrombosis) (HC Code) (HC CODE) (HC Code), and Hypertension.Vitals on Arrival: ED Triage Vitals [09/27/23 0953] Enc Vitals Group    BP 120/73    Pulse (!) 91    Resp 19    Temp 97.7 ?F (36.5 ?C)    Temp src Oral    SpO2 97 %    Weight 87.3 kg    Height 1.854 m    Head Circumference     Peak Flow     Pain Score     Pain Loc     Pain Edu?     Excl. in GC?  Current Dx: Clinical Impressions as of 09/27/23 1455 Fatigue, unspecified type Pulmonary embolism, unspecified chronicity, unspecified pulmonary embolism type, unspecified whether acute cor pulmonale present (HC Code) (HC CODE) (HC Code) Atrial fibrillation, unspecified type (HC Code) (HC CODE) (HC Code) Telemetry: 	[x]  Yes		[]  NoCode Status:   [x]  Full		[]  DNR		[]  DNI		Other (specify):Safety Precautions: [x]  None	[]  Sitter   []  Restraints	[]  Suicidal	[]  Fall Risk	Other (specify):Patient is currently being treated for SEPSIS   []  Yes		[x]  NoMentation/Orientation:	 A&O (Self, person, place, time) x   4  	 Disoriented to:                    	 Deficits: []  Hearing impaired	[]  Blind  []  Nonverbal	 []  Other: ____________________________	Result of CAM (Non ICU): NegativeOxygenation Upon Admission: [x]  RA	[]  NC	[]  Venti  []  Simple Mask []  Other 	Baseline O2 Status? []  Yes	[]  NoAmbulation: [x]  Independent	[]  Cane   []  Walker	[]  Wheelchair	[]  Bedbound		[]  Hemiplegic	[]  Paraplegic	[]  QuadraplegicEliminiation: [x]  Independent	[]  Commode	[]  Bedpan/Urinal  []  Straight Cath []  Foley cath			[]  Urostomy	[]  Colostomy	Other (specify):Diarrhea/Loose stool : []  1x within 24h  []  2x within 24h  []  3x within 24h  [x]  None 	C.Diff Order: 	[]  Ordered- needs to be collected             []  Collected-sent to lab             []  Resulted - Negative C.Diff             []  Resulted - Positive C.Diff[]  Not Ordered   [x]  N/ASkin Alteration: []  Pressure Injury []  Wound [x]  None []  Skin not assessedDiet: [x]  Regular/No order placed	[]  NPO		Other (specify):IV Access: Access Lines   Active PICC Line / PIV Line / Intraosseous Line / ART Line / Line / CVC Line / Implanted Port   Name Placement date Placement time Site Days  Periph IV 09/27/23 1200 cephalic (thumb side), right over-the-needle catheter system 20 gauge Nurse 09/27/23  1200  cephalic (thumb side), right  less than 1    ED Labs: Abnormal Labs Reviewed TROPONIN T HIGH SENSITIVITY, 0 HOUR BASELINE WITH REFLEX ([facility]) - Abnormal; Notable for the following components:     Result Value  High Sensitivity Troponin T 12 (*)   All other components within normal limits PROTIME AND INR - Abnormal; Notable for the following components:  Prothrombin Time 32.8 (*)   INR 3.45 (*)  All other components within normal limits CBC WITH AUTO DIFFERENTIAL - Abnormal; Notable for the following components:  Hemoglobin 17.2 (*)   Hematocrit 51.20 (*)   Monocytes 14.1 (*)   All other components within normal limits COMPREHENSIVE METABOLIC PANEL - Abnormal; Notable for the following components:  Glucose 190 (*)   Aspartate Aminotransferase (AST) 45 (*)   All other components within normal limits URINALYSIS WITH CULTURE REFLEX      ([facility]) - Abnormal; Notable for the following components:  Specific Gravity, UA 1.037 (*)   Glucose, UA 4+ (*)   All other components within normal limits ED EKG Results: Results for orders placed or performed during the hospital encounter of 09/27/23 EKG Result Value Ref Range Status  Heart Rate 97 bpm Final  QRS Interval 100 ms Final  QT Interval 355 ms Final  QTC Interval 451 ms Final  P Axis 0 deg Final  QRS Axis -15 deg Final  T Wave Axis 28 deg Final  P-R Interval 0 msec Final  SEVERITY Abnormal ECG severity Final   Comment: :Atrial fibrillation:Borderline left axis deviation:Previous EKG not available to compare.:Electronically Signed On 09-27-2023 12:06:08 EST by Marvell Fuller MD ED Imaging Results: CTA Chest (PE) w IV ContrastResult Date: 11/6/2024CT angiogram of the chest HISTORY: dyspnea, hx DVT TECHNIQUE: Imaging followed pulmonary embolus protocol (thin sections during rapid administration of intravenous contrast). Images were reconstructed on an independent workstation and saved to PACS. CONTRAST: 75 cc Omnipaque 350 COMPARISON: No previous corresponding imaging at [facility]. [id number] - Total number of known [place] scans and cardiac nuclear medicine studies within the past 12 months: 2 FINDINGS: The main pulmonary arteries are clear. There are small filling defects seen in the right descending pulmonary artery, consistent with small volume of pulmonary emboli. The ascending aorta, aortic arch, and descending thoracic aorta are normal in size. There is cardiac enlargement and there are coronary artery calcification. No pericardial effusion is seen. The lungs are clear of infiltrates. No pulmonary nodule or pleural effusion is seen. There is no pathologic axillary, mediastinal, or hilar adenopathy. The upper abdomen demonstrates a small hiatal hernia. Osseous structures are intact. There is a small volume of pulmonary emboli. Otherwise, no acute pulmonary process is seen.  Results were discussed with Dr. Lady Gary at 2:10 PM. [facility] Radiology Notification System Classification: Routine. Estimated radiation dose: 413.72 mGy-cm Radiation dose lowering was achieved by the use of an iterative reconstruction technique. Reported and signed by:  Renee Harder, MD [place] Head wo IV ContrastResult Date: 11/6/2024CT HEAD WO IV CONTRAST  HISTORY: Headache, new or worsening (Age >= 50y) headache. COMPARISON: None. [id number] - Total number of known [place] scans and cardiac nuclear medicine studies within the past 12 months: 2 P3295 - RADIATION DOSE ACQUIRED DURING SCAN: 866.67 mGy.cm. The [employer] strives for high quality imaging with the lowest possible radiation dose. For more information on medical radiation exposure please visit: [email or web address] . TECHNIQUE: Axial sections were obtained from the skull base to the vertex without the administration of intravenous contrast.  Coronal and sagittal reformatted images were provided.  FINDINGS: There is generalized atrophy. No intracranial hemorrhage is seen. No extra-axial fluid collection is seen.  There is nonspecific white matter disease. Bilateral basal drain catheter calcifications are present.  The ventricles are normal in size and are midline. The visualized paranasal sinuses are clear.    The mastoid air cells are clear.  No gross intraorbital abnormality is seen.    No acute  intracranial abnormality is seen. [facility] Radiology Notification System Classification: Routine. [id number] Reported and signed by:  Renee Harder, MD XR Chest PA or AP-use if patient is unstableResult Date: 11/6/2024Chest HISTORY: Chest Pain IMAGING: AP view of the chest at approximately 10:38. COMPARISON: None FINDINGS: The lungs are clear. The heart and mediastinum are within normal limits. Osseous structures appear intact. No acute process is seen. [facility] Radiology Notification System Classification: Routine. Reported and signed by:  Renee Harder, MD  Medications Administered in ED:   Medications piperacillin-tazobactam (ZOSYN) 4.5 g in dextrose iso-osmotic 100 mL IVPB (has no administration in time range)   Followed by piperacillin-tazobactam (ZOSYN) 4.5 g in dextrose iso-osmotic 100 mL IVPB (has no administration in time range) iohexoL (OMNIPAQUE) 350 mg iodine/mL injection 75 mL (75 mLs Intravenous Given 09/27/23 1320) sodium chloride 0.9% large volume syringe for autoinjector 30 mL (30 mLs Intravenous Given 09/27/23 1321)  IVF/GTT Running Upon ED Departure? []  No	    [x]  Yes (specify):ED Vital Signs: Vitals:  09/27/23 1045 09/27/23 1145 09/27/23 1320 09/27/23 1428 BP:  113/82 (!) 132/94 116/88 Pulse: (!) 92 85 (!) 92 (!) 95 Resp: (!) 21 20 17 20  Temp: 97.9 ?F (36.6 ?C)    TempSrc: Oral    SpO2: 95% 96% 98% 98% Weight:     Height:      Patient Belongings:Is there belongings at patient bedside in a labeled patient belongings bag?          []  No	    [x]  YesDid someone taking belongings home?   [x]  No     []  Yes  Who? (specify)                                   ADDITIONAL NOTES: ED RN and Contact number #:               [place]           [phone]

## 2023-09-27 NOTE — Plan of Care
 Plan of Care Overview/ Patient Status    Problem: Adult Inpatient Plan of CareGoal: Readiness for Transition of CareOutcome: Initial problem identification (Per H&P):  Andrew [name] is a 67 y.o. 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  The patient noted that he feels off balance over the last 3 days.  He was having trouble doing his usual outdoor activities.  He noticed extreme fatigue when he was cutting firewood with his friend on Sunday. This was associated with unsteady gait, profuse sweating, and chills. He denies any coughing, dysuria, flank pain, abdominal pain, nausea, vomiting, diarrhea.  When asked about skin breaks or wounds, he stated that he had Chiggers for a few weeks but it has resolved. A recently healed venous stasis ulcer was also noted by patient. CTA was significant for bilateral small PEs.  Patient was admitted for further management.Chart reviewed. Met with patient at bedside to explain role of CM and verify demographics, PCP, and pharmacy ([facility] [place]). Lives with wife and daughter. Independent at baseline without assistive devices or respiratory equipment.  No home services prior to admission. INR check through [facility] lab. Informs due for INR check tomorrow, and inquiring if can be done while inpatient.  Care team notified of same. No CM needs anticipated. Plan for discharge home with MD follow-up and no skilled services with his wife providing transport. CM will continue to follow.Case Management Screening and Evaluation  Flowsheet Row Most Recent Value Case Management Screening: Chart review completed. If YES to any question below then proceed to CM Eval/Plan  Is there a change in their cognitive function No Do you anticipate that the pt will have any discharge needs requiring CM intervention? No Has there been an unscheduled readmission within the last 30 days and/or four (4) encounters (encounters include: ED, OBS, Inpatient) within the last six (6) months? No Were there services prior to admission ( Examples: Assisted Living, HD, Homecare, Extended Care Facility, Methadone, SNF, Outpatient Infusion Center) No Negative/Positive Screen Negative Screening: Case Management department will follow patient's progress and discuss plan of care with treatment team. Case manager will take lead to arrange post-acute care services and continue to work with the care team as the patient progresses towards discharge Yes Case Manager Attestation  I have reviewed the medical record and completed the above screen. CM staff will follow patient's progress and discuss the plan of care with the Treatment Team. Yes Case Manager Attestation  I have reviewed the medical record and completed the above evaluation with the following recommendations. Yes Discharge Planning Coordination Recommendations  Discharge Planning Coordination Recommendations Home with MD follow-up/No needs identified Case Manager reviewed plan of care/ continuum of care need's with  Patient, Interdisciplinary Team, Transitional care rounds  Raymon Mutton, BSN, [facility] ManagerWesterly [phone]

## 2023-09-28 ENCOUNTER — Ambulatory Visit: Admit: 2023-09-28 | Payer: BLUE CROSS/BLUE SHIELD | Primary: Internal Medicine

## 2023-09-28 ENCOUNTER — Inpatient Hospital Stay: Admit: 2023-09-28 | Payer: MEDICARE

## 2023-09-28 DIAGNOSIS — I2699 Other pulmonary embolism without acute cor pulmonale: Secondary | ICD-10-CM

## 2023-09-28 LAB — CBC WITH AUTO DIFFERENTIAL
BKR WAM ABSOLUTE IMMATURE GRANULOCYTES.: 0.02 x 1000/ÂµL (ref 0.00–0.30)
BKR WAM ABSOLUTE LYMPHOCYTE COUNT.: 2.26 x 1000/ÂµL (ref 0.60–3.70)
BKR WAM ABSOLUTE NRBC (2 DEC): 0 x 1000/ÂµL (ref 0.00–1.00)
BKR WAM ANC (ABSOLUTE NEUTROPHIL COUNT): 3.46 x 1000/ÂµL (ref 2.00–7.60)
BKR WAM BASOPHIL ABSOLUTE COUNT.: 0.06 x 1000/ÂµL (ref 0.00–1.00)
BKR WAM BASOPHILS: 0.9 % (ref 0.0–1.4)
BKR WAM EOSINOPHIL ABSOLUTE COUNT.: 0.12 x 1000/ÂµL (ref 0.00–1.00)
BKR WAM EOSINOPHILS: 1.8 % (ref 0.0–5.0)
BKR WAM HEMATOCRIT (2 DEC): 52.8 % — ABNORMAL HIGH (ref 38.50–50.00)
BKR WAM HEMOGLOBIN: 17.2 g/dL — ABNORMAL HIGH (ref 13.2–17.1)
BKR WAM IMMATURE GRANULOCYTES: 0.3 % (ref 0.0–1.0)
BKR WAM LYMPHOCYTES: 33.7 % (ref 17.0–50.0)
BKR WAM MCH (PG): 30.4 pg (ref 27.0–33.0)
BKR WAM MCHC: 32.6 g/dL (ref 31.0–36.0)
BKR WAM MCV: 93.5 fL (ref 80.0–100.0)
BKR WAM MONOCYTE ABSOLUTE COUNT.: 0.79 x 1000/ÂµL (ref 0.00–1.00)
BKR WAM MONOCYTES: 11.8 % (ref 4.0–12.0)
BKR WAM MPV: 10.2 fL (ref 8.0–12.0)
BKR WAM NEUTROPHILS: 51.5 % (ref 39.0–72.0)
BKR WAM NUCLEATED RED BLOOD CELLS: 0 % (ref 0.0–1.0)
BKR WAM PLATELETS: 163 x1000/ÂµL (ref 150–420)
BKR WAM RDW-CV: 14.4 % (ref 11.0–15.0)
BKR WAM RED BLOOD CELL COUNT.: 5.65 M/ÂµL (ref 4.00–6.00)
BKR WAM WHITE BLOOD CELL COUNT: 6.7 x1000/ÂµL (ref 4.0–11.0)

## 2023-09-28 LAB — COMPREHENSIVE METABOLIC PANEL
BKR ALANINE AMINOTRANSFERASE (ALT): 46 U/L (ref 12–78)
BKR ALBUMIN: 3.7 g/dL (ref 3.4–5.0)
BKR ALKALINE PHOSPHATASE: 48 U/L (ref 45–117)
BKR ANION GAP (LM): 7 mmol/L (ref 5–15)
BKR ASPARTATE AMINOTRANSFERASE (AST): 26 U/L (ref 15–37)
BKR BILIRUBIN TOTAL: 1.8 mg/dL — ABNORMAL HIGH (ref 0.2–1.0)
BKR BLOOD UREA NITROGEN: 15 mg/dL (ref 7–18)
BKR CALCIUM: 8.9 mg/dL (ref 8.5–10.1)
BKR CHLORIDE: 106 mmol/L (ref 98–107)
BKR CO2: 26 mmol/L (ref 21–32)
BKR CREATININE: 0.98 mg/dL (ref 0.70–1.30)
BKR EGFR, CREATININE (CKD-EPI 2021): >60 mL/min/{1.73_m2} (ref >=60)
BKR GLOBULIN: 3.5 g/dL (ref 2.5–5.0)
BKR GLUCOSE: 124 mg/dL — ABNORMAL HIGH (ref 65–110)
BKR POTASSIUM: 4.3 mmol/L (ref 3.5–5.1)
BKR PROTEIN TOTAL: 7.2 g/dL (ref 6.4–8.2)
BKR SODIUM: 139 mmol/L (ref 136–145)

## 2023-09-28 LAB — RESPIRATORY VIRUS PCR PANEL (W/O COVID/FLU/RSV) (BH GH LMW YH)
BKR ADENOVIRUS: NEGATIVE
BKR CORONAVIRUS 229E: NEGATIVE
BKR CORONAVIRUS HKU1: NEGATIVE
BKR CORONAVIRUS NL63: NEGATIVE
BKR CORONAVIRUS OC43: NEGATIVE
BKR HUMAN METAPNEUMOVIRUS (HMPV): NEGATIVE
BKR PARAINFLUENZA VIRUS 1: NEGATIVE
BKR PARAINFLUENZA VIRUS 2: NEGATIVE
BKR PARAINFLUENZA VIRUS 3: NEGATIVE
BKR PARAINFLUENZA VIRUS 4: NEGATIVE
BKR RHINOVIRUS: NEGATIVE

## 2023-09-28 LAB — HEMOGLOBIN A1C: BKR HEMOGLOBIN A1C: 5.9 % (ref 4.2–6.3)

## 2023-09-28 LAB — MIXING STUDY PTT  (BH GH LMW YH)
BKR PTT 1:1 MIX 0: 27.5 s (ref 22.0–35.0)
BKR PTT 1:1 MIX 60: 30.2 s (ref 22.0–35.0)
BKR PTT 1:1 MIX CONTROL 0: 27.6 s
BKR PTT 1:1 MIX CONTROL 60: 31 s

## 2023-09-28 LAB — ANAPLASMA AND EHRLICHIA BY PCR     (BH GH LMW YH)
BKR ANAPLASMA PHAGOCYTOPHILUM BY PCR: NEGATIVE
BKR EHRLICHIA SPP BY PCR: NEGATIVE

## 2023-09-28 LAB — PROCALCITONIN     (BH GH LMW Q YH): BKR PROCALCITONIN: 0.05 ng/mL

## 2023-09-28 LAB — LYME ANTIBODIES W/RFLX TO CONFIRM (MODIFIED TWO-TIER TESTING)
BKR LYME TOTAL ANTIBODY INTERPRETATION: UNDETERMINED — AB
BKR LYME TOTAL ANTIBODY LI: 0.96 LI — ABNORMAL HIGH (ref <=0.90)

## 2023-09-28 LAB — LUPUS ANTICOAGULANT     (BH GH L LMW YH)
BKR DRV NORMALIZED RATIO: 1.67 — ABNORMAL HIGH (ref <=1.20)
BKR SCT NORMALIZED RATIO: 0.99 (ref <=1.20)

## 2023-09-28 LAB — C-REACTIVE PROTEIN     (CRP): BKR C-REACTIVE PROTEIN (LM): <0.3 mg/dL (ref <0.30)

## 2023-09-28 LAB — SPECIAL COAGULATION MD INTERPRETATION (LAB ORDERABLE ONLY) (GH YH)

## 2023-09-28 LAB — PT/INR AND PTT (BH GH L LMW YH)
BKR INR: 1.49 — ABNORMAL HIGH (ref 0.89–1.18)
BKR INR: 1.87 — ABNORMAL HIGH (ref 0.86–1.13)
BKR PARTIAL THROMBOPLASTIN TIME: 27 s (ref 23.0–31.0)
BKR PARTIAL THROMBOPLASTIN TIME: 33 s — ABNORMAL HIGH (ref 23.0–31.0)
BKR PROTHROMBIN TIME: 14.9 s — ABNORMAL HIGH (ref 9.3–11.9)
BKR PROTHROMBIN TIME: 19.7 s — ABNORMAL HIGH (ref 9.6–12.3)

## 2023-09-28 LAB — PTT- POLYBRENE (LAB ORDERABLE ONLY) (YH): BKR PTT - POLYBRENE: 36.2

## 2023-09-28 LAB — UA REFLEX CULTURE

## 2023-09-28 MED ORDER — IOHEXOL 350 MG IODINE/ML INTRAVENOUS SOLUTION
350 | Freq: Once | INTRAVENOUS | Status: CP | PRN
Start: 2023-09-28 — End: ?
  Administered 2023-09-28: 19:00:00 350 mL via INTRAVENOUS

## 2023-09-28 MED ORDER — SODIUM CHLORIDE 0.9 % LARGE VOLUME SYRINGE FOR AUTOINJECTOR
Freq: Once | INTRAVENOUS | Status: CP | PRN
Start: 2023-09-28 — End: ?
  Administered 2023-09-28: 19:00:00 via INTRAVENOUS

## 2023-09-28 MED ORDER — PERFLUTREN LIPID MICROSPHERES 1.3 ML/10 ML NS INJ (IN
Freq: Once | INTRAVENOUS | Status: DC | PRN
Start: 2023-09-28 — End: 2023-10-01

## 2023-09-28 MED ORDER — ENOXAPARIN 100 MG/ML SUBCUTANEOUS SYRINGE
100 | Freq: Two times a day (BID) | SUBCUTANEOUS | Status: DC
Start: 2023-09-28 — End: 2023-09-29
  Administered 2023-09-28 – 2023-09-29 (×3): 100 mL via SUBCUTANEOUS

## 2023-09-28 NOTE — Other
 [facility]  Hematology/Oncology Consult Note Patient Data:  Patient Name: Andrew [name] Age: 67 y.o. DOB: 11-02-1956	 MRN: [id number]	 Consult Information: Consultation requested by: Charyl Dancer Nasr,*Reason for consultation:  Pulmonary embolismSource of Information: Patient, Doctor, and EMR/Previous RecordPresentation History: HPI Andrew [name] is a 67 y.o. male admitted with Pulmonary embolism, unspecified chronicity, unspecified pulmonary embolism type, unspecified whether acute cor pulmonale present (HC Code) (HC CODE) (HC Code).The patient is seen in consultation today for pulmonary embolism.  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  Patient presented to the hospital with generalized weakness and chills and sweats.  In the few days leading up to admission the patient has felt off balance.  He reported extreme fatigue.  He noted it unsteady gait.  Patient denied having any abdominal pain nausea vomiting or diarrhea.  He denied cough.  He denied dysuria.  He reported a recently healed ulcer on his leg.  Patient denied having any tick exposure.Patient noted that his INRs had been running high recently however upon review of the [facility] records it appears that on October 31st the patient had a subtherapeutic INR.  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 The initial DVT was suspected to be genetic, as the patient's paternal aunt had a similar issue, which ultimately led to her death due to pulmonary embolism. The patient's DVT occurred during a period of increased sedentary behavior due to a change in job responsibilities. The patient has been on Coumadin since the DVT, with regular monitoring of INR levels, which were mostly within range until a recent move and change in healthcare providers.Over the past few months, the patient's INR levels have been difficult to stabilize, fluctuating from 1.2 to 5.3 within a few days. The patient also reports occasional swelling in the leg affected by the DVT, which is managed with a compression sock. In 2010, the patient developed a stasis ulcer, which was treated with ablation and compression therapy.Recently, the patient experienced a sudden onset of severe night sweats and chills, leading to extreme exhaustion and difficulty standing. These symptoms were intermittent, with periods of feeling better followed by a return of the symptoms. The patient also reported episodes of feeling clammy and sweaty while working outdoors, followed by chills, which were managed by adjusting clothing.The patient has a history of COVID-19 infection in the spring of the previous year and is due for a COVID-19 vaccine. The patient denies any recent surgeries or plane trips and is a non-smoker, although he occasionally uses cannabis. The patient's family history includes an aunt with DVT, a father who died of colon cancer in his mid-forties, and a sister who is a 14-year survivor of cervical cancer. The patient has no biological children.Patient denies any recent surgeries, prolonged periods of immobility or plane rides.Review of Allergies/Medical History/Medications: Allergies is allergic to sulfa (sulfonamide antibiotics).Past Medical History  has a past medical history of Diabetes mellitus (HC Code), DVT (deep venous thrombosis) (HC Code) (HC CODE) (HC Code), and Hypertension.Past Surgical History  has no past surgical history on file.Family History family history is not on file.Social History  reports that he has never smoked. He does not have any smokeless tobacco history on file. No history on file for alcohol use and drug use.Prior to Admission  Medications Medications Prior to Admission Medication Sig Dispense Refill Last Dose  amLODIPine (NORVASC) 5 mg tablet Take 1 tablet (5 mg total) by mouth daily.   09/27/2023  JANUVIA 100 mg tablet Take 1 tablet (100 mg total) by mouth daily.   09/27/2023  JARDIANCE 10 mg tablet Take 1 tablet (10 mg total) by mouth daily.   09/27/2023  lisinopriL (PRINIVIL,ZESTRIL) 10 mg tablet Take 1 tablet (10 mg total) by mouth daily.   09/27/2023  LORazepam (ATIVAN) 0.5 mg tablet Take 1 tablet (0.5 mg total) by mouth daily as needed.     warfarin (COUMADIN) 7.5 mg tablet Take as directed, up to 1.5 tablets per day. 135 tablet 3   BLOOD GLUCOSE METER device Use to check blood sugar as needed. Diabetes mellitus type 2 [E11.9]. Please dispense glucometer covered by patient's insurance. 1 each 0   blood sugar diagnostic test strips Use to check blood sugar as needed. Diabetes mellitus type 2 [E11.9]. Please dispense supplies covered by patient's insurance. 10 each 3   lancets Use to check blood sugar as needed. Diabetes mellitus type 2 [E11.9]. Please dispense supplies covered by patient's insurance. 50 each 0   tadalafiL (CIALIS) 20 mg tablet Take 1 tablet (20 mg total) by mouth daily as needed.    Inpatient Medications:Current Facility-Administered Medications Medication Dose Route Frequency Provider Last Rate Last Admin  amLODIPine (NORVASC) tablet 5 mg  5 mg Oral Daily Wilson, Mina Elly Modena, MD      empagliflozin (JARDIANCE) tablet 10 mg  10 mg Oral Daily Holland Falling Elly Modena, MD      influenza vaccine (PF) [phone] (adjuvanted, > or = 67 yo) injection 0.5 mL  0.5 mL Intramuscular Vaccination x 1 Brice, Yardley, DO      insulin lispro (Admelog, HumaLOG) Correction Scale 1-18 Units  1-18 Units Subcutaneous TID AC Wilson, [address] Elly Modena, MD      lisinopriL (PRINIVIL,ZESTRIL) tablet 10 mg  10 mg Oral Daily Wilson, Mina Elly Modena, MD      piperacillin-tazobactam (ZOSYN) 4.5 g in dextrose iso-osmotic 100 mL IVPB  4.5 g Intravenous Q6H Wilson, Dorene Grebe Elly Modena, MD 33.3 mL/hr at 09/28/23 0250 4.5 g at 09/28/23 0250  polyethylene glycol (MIRALAX) packet 17 g  17 g Oral BID Holland Falling Elly Modena, MD   17 g at 09/27/23 1657  sodium chloride 0.9 % flush 3 mL  3 mL IV Push Q8H Wilson, Mina Elly Modena, MD      acetaminophen, dextrose (GLUCOSE) oral liquid 15 g **OR** fruit juice **OR** skim milk, dextrose (GLUCOSE) oral liquid 30 g **OR** fruit juice, dextrose injection, dextrose injection, glucagon, ondansetron **OR** ondansetron (ZOFRAN) IV Push, sodium chlorideCurrent Facility-Administered Medications Medication Dose Route Frequency Last Rate  acetaminophen  650 mg Oral Q6H PRN    amLODIPine  5 mg Oral Daily    dextrose (GLUCOSE) oral liquid 15 g  15 g Oral Q15 MIN PRN    Or  fruit juice  120 mL Oral Q15 MIN PRN    Or  skim milk  240 mL Oral Q15 MIN PRN    dextrose (GLUCOSE) oral liquid 30 g  30 g Oral Q15 MIN PRN    Or  fruit juice  240 mL Oral Q15 MIN PRN    dextrose injection  12.5 g Intravenous Q15 MIN PRN    dextrose injection  25 g Intravenous Q15 MIN PRN    empagliflozin  10 mg Oral Daily    glucagon  1 mg  Intramuscular Once PRN    Influenza vaccine orderable (Age over 6 months)  0.5 mL Intramuscular Vaccination x 1    insulin lispro  1-18 Units Subcutaneous TID AC    lisinopriL  10 mg Oral Daily    ondansetron  4 mg Oral Q6H PRN    Or  ondansetron (ZOFRAN) IV Push  4 mg IV Push Q6H PRN    piperacillin-tazobactam  4.5 g Intravenous Q6H 4.5 g (09/28/23 0250)  polyethylene glycol  17 g Oral BID    sodium chloride  3 mL IV Push Q8H    sodium chloride  3 mL IV Push PRN for Line Care   Review of Systems: See HPI for details Physical Exam: Vital Signs:Blood pressure 101/66, pulse 79, temperature 98.1 ?F (36.7 ?C), temperature source Oral, resp. rate 18, height 6' 1 (1.854 m), weight 108.1 kg, SpO2 98 %.Physical ExamConstitutional:     Appearance: Normal appearance. He is not ill-appearing. HENT:    Head: Normocephalic and atraumatic. Eyes:    General: No scleral icterus.Cardiovascular:    Heart sounds: No murmur heard.Pulmonary:    Effort: Pulmonary effort is normal. No respiratory distress.    Breath sounds: Normal breath sounds. Abdominal:    General: Abdomen is flat. Bowel sounds are normal.    Palpations: Abdomen is soft. Musculoskeletal:    Right lower leg: No edema.    Left lower leg: No edema. Skin:   General: Skin is warm and dry. Neurological:    Mental Status: He is alert.    Comments: Awake alert Psychiatric:       Mood and Affect: Mood normal. Review of Labs/Diagnostics: Lab Review:Last 8 hours: Recent Results (from the past 8 hour(s)) PT/INR and PTT (BH GH L LMW YH)  Collection Time: 09/28/23  5:29 AM Result Value Ref Range  Prothrombin Time 14.9 (H) 9.3 - 11.9 seconds  PTT 27.0 23.0 - 31.0 seconds  INR 1.49 (H) 0.89 - 1.18 CBC auto differential  Collection Time: 09/28/23  5:29 AM Result Value Ref Range  WBC 6.7 4.0 - 11.0 x1000/?L  RBC 5.65 4.00 - 6.00 M/?L  Hemoglobin 17.2 (H) 13.2 - 17.1 g/dL  Hematocrit 74.25 (H) 95.63 - 50.00 %  MCV 93.5 80.0 - 100.0 fL  MCH 30.4 27.0 - 33.0 pg  MCHC 32.6 31.0 - 36.0 g/dL  RDW-CV 87.5 64.3 - 32.9 %  Platelets 163 150 - 420 x1000/?L  MPV 10.2 8.0 - 12.0 fL  Neutrophils 51.5 39.0 - 72.0 %  Lymphocytes 33.7 17.0 - 50.0 %  Monocytes 11.8 4.0 - 12.0 %  Eosinophils 1.8 0.0 - 5.0 %  Basophil 0.9 0.0 - 1.4 %  Immature Granulocytes 0.3 0.0 - 1.0 %  nRBC 0.0 0.0 - 1.0 %  Absolute Lymphocyte Count 2.26 0.60 - 3.70 x 1000/?L  Monocyte Absolute Count 0.79 0.00 - 1.00 x 1000/?L  Eosinophil Absolute Count 0.12 0.00 - 1.00 x 1000/?L  Basophil Absolute Count 0.06 0.00 - 1.00 x 1000/?L  Absolute Immature Granulocyte Count 0.02 0.00 - 0.30 x 1000/?L  Absolute nRBC 0.00 0.00 - 1.00 x 1000/?L  ANC (Abs Neutrophil Count) 3.46 2.00 - 7.60 x 1000/?L POC Glucose (Fingerstick)  Collection Time: 09/28/23  7:07 AM Result Value Ref Range  Glucose, Meter 161 (H) 65 - 110 mg/dL Diagnostic Review:XR Abdomen AP And Decub or Erect ViewResult Date: 11/6/2024PROCEDURE:  XR ABDOMEN AP AND DECUB OR ERECT VIEW CLINICAL INDICATION:  looking for free air, suspect septic embolic from an abdominal source, unable to  get [place] at the moment TECHNIQUE: Upright view of the abdomen. COMPARISON:  No prior pertinent imaging available. FINDINGS: A small portion of the superior aspect liver and right hemidiaphragm are excluded.  There is a paucity of small bowel gas with no abnormal small bowel dilatation. Stool is seen throughout the majority the colon. No free subdiaphragmatic gas noted.  No air-fluid levels noted.   No acute osseous fracture noted.  No radiopaque foreign body is noted. Findings appear most consistent with constipation. No discernible free subdiaphragmatic gas. [facility] Radiology Notification System Classification: Routine. Reported and signed by:  Herma Ard, MD CTA Chest (PE) w IV ContrastResult Date: 11/6/2024CT angiogram of the chest HISTORY: dyspnea, hx DVT TECHNIQUE: Imaging followed pulmonary embolus protocol (thin sections during rapid administration of intravenous contrast). Images were reconstructed on an independent workstation and saved to PACS. CONTRAST: 75 cc Omnipaque 350 COMPARISON: No previous corresponding imaging at [facility]. [id number] - Total number of known [place] scans and cardiac nuclear medicine studies within the past 12 months: 2 FINDINGS: The main pulmonary arteries are clear. There are small filling defects seen in the right descending pulmonary artery, consistent with small volume of pulmonary emboli. The ascending aorta, aortic arch, and descending thoracic aorta are normal in size. There is cardiac enlargement and there are coronary artery calcification. No pericardial effusion is seen. The lungs are clear of infiltrates. No pulmonary nodule or pleural effusion is seen. There is no pathologic axillary, mediastinal, or hilar adenopathy. The upper abdomen demonstrates a small hiatal hernia. Osseous structures are intact. There is a small volume of pulmonary emboli. Otherwise, no acute pulmonary process is seen.  Results were discussed with Dr. Lady Gary at 2:10 PM. [facility] Radiology Notification System Classification: Routine. Estimated radiation dose: 413.72 mGy-cm Radiation dose lowering was achieved by the use of an iterative reconstruction technique. Reported and signed by:  Renee Harder, MD [place] wo IV ContrastResult Date: 11/6/2024CT HEAD WO IV CONTRAST  HISTORY: Headache, new or worsening (Age >= 50y) headache. COMPARISON: None. H8469 - Total number of known [place] scans and cardiac nuclear medicine studies within the past 12 months: 2 G2952 - RADIATION DOSE ACQUIRED DURING SCAN: 866.67 mGy.cm. The [employer] strives for high quality imaging with the lowest possible radiation dose. For more information on medical radiation exposure please visit: [email or web address] . TECHNIQUE: Axial sections were obtained from the skull base to the vertex without the administration of intravenous contrast.  Coronal and sagittal reformatted images were provided.  FINDINGS: There is generalized atrophy. No intracranial hemorrhage is seen. No extra-axial fluid collection is seen.  There is nonspecific white matter disease. Bilateral basal drain catheter calcifications are present.  The ventricles are normal in size and are midline. The visualized paranasal sinuses are clear.    The mastoid air cells are clear.  No gross intraorbital abnormality is seen.    No acute intracranial abnormality is seen. [facility] Radiology Notification System Classification: Routine. [id number] Reported and signed by:  Renee Harder, MD XR Chest PA or AP-use if patient is unstableResult Date: 11/6/2024Chest HISTORY: Chest Pain IMAGING: AP view of the chest at approximately 10:38. COMPARISON: None FINDINGS: The lungs are clear. The heart and mediastinum are within normal limits. Osseous structures appear intact. No acute process is seen. [facility] Radiology Notification System Classification: Routine. Reported and signed by:  Renee Harder, MD   Impression: The patient is a 67 year old male with history of left lower extremity DVT for which she has been on  Coumadin for 30 years currently admitted with complaint of weakness chills and sweats.  Patient was found to have bilateral pulmonary emboli.  This occurs in the context of a subtherapeutic INR on October 31st. Recommendations: Bilateral PE - history of DVT 30 years ago on Coumadin.  Patient noted to have a subtherapeutic INR of 1.2 on September 21, 2023.  Subsequently his Coumadin was adjusted and the patient's INR was elevated upon admission.  Would recommend transition to DOAC in order to achieve more consistency with dosing.  Differential for the pulmonary emboli-question septic embolic vs other.  INR 1.49 today.  Would resume full-dose anticoagulation at this time with alternate anticoagulation with Eliquis or other DOAC vs lovenox.  Plan to pursue a hereditary hypercoagulable workup as an outpatient.  Infectious workup as per hospitalist team.  Patient is empirically on Zosyn.  Id has been consulted.New onset AFib - cardiology has been consulted.  Patient's hemoglobin is at upper end of normal at 17.2.  Will check erythropoietin level.Will followDiscussed with Dr. Andrey Campanile today and yesterday.Signed: Colbert Ewing, MDAssistant Professor of Clinical Medicine (Medical Oncology and Hematology)[facility] [place], [place], [place] [phone]

## 2023-09-28 NOTE — Other
 Infectious Disease CONSULTLeonard Densel, [place] 1956/07/22, [id number] Reason for consultation:  [place],Consultation requested by: Holland Falling Brilliant Nasr,*ER date: 09/27/2023; Admit date: 11/6/2024Today's date: 09/28/2023; Hospital Day: 1Subjective/ Interim History/ ROS: HPI: Andrew [name] is a 67 y.o. 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  [place] of the chest showed bilateral small PEs.Labs shows no leukocytosis, normal procalcitonin and lactic acid.  UA is unremarkable.Infectious disease consulted for further recommendation and to rule out infectious process.  Was started on ZosynPatient reported being in the woods possible tick exposure.  He is monogamous sexually active with his wife.  He had multiple scab wounds over both hands from cutting wood.Review of Systems:General:  Chills, night sweatsWt Readings from Last 7 Encounters: 09/27/23 108.1 kg 07/07/22 105.4 kg Head: no significant hair lossEye: no eye discharge, no visual changesENT: no ear discharge, no hearing loss , no sore throat, no dizzinessPulmonary: no productive cough, no SOB, no wheezing.CVS: no lightheadedness or palpitation, no chest pain, no leg edema, no orthopneaGI: no dysphagia, no odynophagia, no reflux, no nausea, no vomiting, no early satiety, no jaundiceGU: no dysuria, no frequency, no urgency, no hematuria, no dischargeMusculoskeletal: no muscle aches/pain, no joint swelling/ redness / painSkin: no rashes.Neurological: no dizziness or headachePsychiatric:  No depression, Not feeling anxious, No past psychiatric historyReviewed Allergies/Meds/Hx Allergies Allergen Reactions  Sulfa (Sulfonamide Antibiotics) Hives Reviewed Current Outpatient Medications Medication Instructions  amLODIPine (NORVASC) 5 mg tablet 1 tablet, Oral, Every 24 Hours Scheduled  BLOOD GLUCOSE METER device Use to check blood sugar as needed. Diabetes mellitus type 2 [E11.9]. Please dispense glucometer covered by patient's insurance.  blood sugar diagnostic test strips Use to check blood sugar as needed. Diabetes mellitus type 2 [E11.9]. Please dispense supplies covered by patient's insurance.  Januvia 100 mg, Oral, Daily  Jardiance 10 mg, Oral, Daily  lancets Use to check blood sugar as needed. Diabetes mellitus type 2 [E11.9]. Please dispense supplies covered by patient's insurance.  lisinopriL (PRINIVIL,ZESTRIL) 10 mg, Oral, Daily  LORazepam (ATIVAN) 0.5 mg, Oral, DAILY PRN  tadalafiL (CIALIS) 20 mg, Oral, DAILY PRN  warfarin (COUMADIN) 7.5 mg tablet Take as directed, up to 1.5 tablets per day. Inpatient MEDICATION LISTCurrent Facility-Administered Medications Medication Dose Route Frequency Provider Last Rate Last Admin  empagliflozin (JARDIANCE) tablet 10 mg  10 mg Oral Daily Holland Falling Elly Modena, MD   10 mg at 09/28/23 4098  insulin lispro (Admelog, HumaLOG) Correction Scale 1-18 Units  1-18 Units Subcutaneous TID AC Wilson, Mady Haagensen, MD   1 Units at 09/28/23 1191  lisinopriL (PRINIVIL,ZESTRIL) tablet 10 mg  10 mg Oral Daily Holland Falling Elly Modena, MD   10 mg at 09/28/23 0808  piperacillin-tazobactam (ZOSYN) 4.5 g in dextrose iso-osmotic 100 mL IVPB  4.5 g Intravenous Q6H Holland Falling Elly Modena, MD 33.3 mL/hr at 09/28/23 0815 4.5 g at 09/28/23 0815  polyethylene glycol (MIRALAX) packet 17 g  17 g Oral BID Holland Falling Elly Modena, MD   17 g at 09/28/23 4782  sodium chloride 0.9 % flush 3 mL  3 mL IV Push Q8H Wilson, Mina Elly Modena, MD     Infusion MEDICATIONS:PRN Medications: acetaminophen, dextrose (GLUCOSE) oral liquid 15 g **OR** fruit juice **OR** skim milk, dextrose (GLUCOSE) oral liquid 30 g **OR** fruit juice, dextrose injection, dextrose injection, glucagon,  ondansetron **OR** ondansetron (ZOFRAN) IV Push, sodium chlorideThe patient  has a past medical history of Diabetes mellitus (HC Code), DVT (deep venous thrombosis) (HC Code) (HC CODE) (HC Code), and Hypertension.The patient  has no past surgical history on file.Family history: Family history reviewed and non-contributoryHistory reviewed. No pertinent family history.Social History Socioeconomic History  Marital status: Married Tobacco Use  Smoking status: Never Social Determinants of Health Food Insecurity: No Food Insecurity (09/27/2023)  Hunger Vital Sign   Worried About Running Out of Food in the Last Year: Never true   Ran Out of Food in the Last Year: Never true Transportation Needs: No Transportation Needs (09/27/2023)  PRAPARE - Designer, jewellery (Medical): No   Lack of Transportation (Non-Medical): No Housing Stability: Low Risk  (09/27/2023)  Housing Stability   Housing Stability: I have a steady place to live Objective: BP 98/63  - Pulse (!) 94  - Temp 98.1 ?F (36.7 ?C) (Oral)  - Resp 18  - Ht 6' 1 (1.854 m)  - Wt 108.1 kg  - SpO2 96%  - BMI 31.44 kg/m? Temp (24hrs), Avg:98.3 ?F (36.8 ?C), Min:97.7 ?F (36.5 ?C), Max:99.1 ?F (37.3 ?C)Gross Totals (Last 24 hours) at 09/28/2023 0851Last data filed at 09/28/2023 0521Intake 960 ml Output 1100 ml Net -140 ml Physical Exam 67 y.o. [place]  LINES /Tubes:  Peripheral linesHEENT: Anicteric sclerae, moist conjunctivae, PERRLA. Pale. Cranial Nerves: Neck: no evidence of mass and trachea appears midline. Respiratory:  Clear to auscultation.  Cardiovascular:  RRR no murmurAbdomen:  Soft nondistendedExtremities:  No leg edemaSkin: Normal turgor, and texture.  Neurologic:  Left oriented x3LABS: Estimated Creatinine Clearance: 83 mL/min (by C-G formula based on SCr of 0.98 mg/dL).GNF:AOZHYQ Labs Lab 11/06/241016 11/07/240529 WBC 4.2 6.7 ANCANC 2.13 3.46 HGB 17.2* 17.2* HCT 51.20* 52.80* MCV 91.1 93.5 PLT 162 163 WBC trend: Lab Results Component Value Date  WBC 6.7 09/28/2023  WBC 4.2 09/27/2023  WBC 6.2 07/07/2022 Iron studies/reticulocytes:=-=-=-=-=-=-=-No results found for: IRON, TIBC, LABTRANS, FERRITIN, RETICCTPCTBasic Metabolic Panel:Recent Labs Lab 11/06/241016 11/07/240529 BUN 13 15 CREATININE 1.06 0.98 NA 136 139 K 4.4 4.3 CL 106 106 CO2 24 26 CALCIUM 9.0 8.9 Recent Labs Lab 11/06/241016 11/06/241654 11/06/242149 11/07/240529 11/07/240707 GLU 190* 120* 140* 124* 161* Liver profile:Recent Labs Lab 11/06/241016 11/07/240529 PROT 7.5 7.2 ALBUMIN 3.7 3.7 GLOB 3.8 3.5 BILITOT 0.7 1.8* ALKPHOS 49 48 ALT 56 46 AST 45* 26 PT/INR/PTT: Lab Results Component Value Date  INR 1.49 (H) 09/28/2023  PTT 27.0 09/28/2023 Inflammatory profile: =-=-=-=-=-=-Lab Results Component Value Date  CRP <0.30 09/27/2023  PROCALCITON 0.05 09/27/2023 Urinalysis:Recent Labs Lab 11/06/241109 PROTEINUA Negative GLUCOSEU 4+* KETONESU Negative BLOODU Negative LEUKOCYTESUR Negative NITRITE Negative MICROBIOLOGY:Blood cultures: Lab Results Component Value Date  LABBLOO No Growth to Date 09/27/2023  LABBLOO No Growth to Date 09/27/2023  LABBLOO No Growth to Date 09/27/2023 Urine cultures:No results found for: LABURINImaging studies: 11/6 chest x-ray no acute process 11/6 [place] head without IV contrast:  No acute intracranial abnormality 11/6 CTA chest with IV contrast:  Small volume of pulmonary emboli no acute pulmonary process seen 11/6 x-ray abdomen findings appear most consistent with constipation.Assessment & Plan:  Andrew [name] is a 67 y.o. male with past medical history of diabetes, hypertension, left lower extremity DVT on Coumadin for 30 years who presented to [facility] ER on 09/27/2023 generalized weakness with chills and profuse sweating found to have bilateral small PE Patient ongoing symptoms likely related to blood clots.No leukocytosis, normal procalcitonin and lactic acid To rule out  tick-borne illness, Lyme antibody, Babesia smear and Anaplasma and Ehrlichia PCR Follow up blood cultures, if negative for 48 hours to stop ZosynDiscussed with team. Elmyra Ricks, MD Infectious Diseases11/7/20248:51 AM

## 2023-09-28 NOTE — Other
 I was asked by Dr. Andrey Campanile to evaluate the patient for AFib.  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  He reports an twin T 51 when living in [place] he underwent an extensive cardiac evaluation for dyspnea and was told he had a ?healthy heart?.  Offers at 300 lb and lost about 100 lb and has been feeling well.  He offers usually active able to cut wood over the weekend.  The last few days he has had some unsteadiness and chills.  He ultimately presented to the hospital and had a [place] PA that suggested bilateral pulmonary embolisms.  He offers last week his INR was subtherapeutic but this week it has been supratherapeutic.  He was incidentally noted to be in AFib for which were asked to see him.  He denies a sensation of irregular heartbeats.  He denies any chest pains.  He was having some shortness of breath over the last few days.  He offers no change in his chronic left lower extremity edema.Scheduled Meds:Current Facility-Administered Medications Medication Dose Route Frequency Provider Last Rate Last Admin  amLODIPine (NORVASC) tablet 5 mg  5 mg Oral Daily Holland Falling Elly Modena, MD   5 mg at 09/28/23 1610  empagliflozin (JARDIANCE) tablet 10 mg  10 mg Oral Daily Holland Falling Elly Modena, MD   10 mg at 09/28/23 9604  insulin lispro (Admelog, HumaLOG) Correction Scale 1-18 Units  1-18 Units Subcutaneous TID AC Wilson, Mady Haagensen, MD   1 Units at 09/28/23 5409  lisinopriL (PRINIVIL,ZESTRIL) tablet 10 mg  10 mg Oral Daily Holland Falling Elly Modena, MD   10 mg at 09/28/23 0808  piperacillin-tazobactam (ZOSYN) 4.5 g in dextrose iso-osmotic 100 mL IVPB  4.5 g Intravenous Q6H Holland Falling Elly Modena, MD 33.3 mL/hr at 09/28/23 0815 4.5 g at 09/28/23 0815  polyethylene glycol (MIRALAX) packet 17 g  17 g Oral BID Holland Falling Elly Modena, MD   17 g at 09/28/23 8119  sodium chloride 0.9 % flush 3 mL  3 mL IV Push Q8H Wilson, Mina Elly Modena, MD     Continuous Infusions:PRN Meds:.acetaminophen, dextrose (GLUCOSE) oral liquid 15 g **OR** fruit juice **OR** skim milk, dextrose (GLUCOSE) oral liquid 30 g **OR** fruit juice, dextrose injection, dextrose injection, glucagon, ondansetron **OR** ondansetron (ZOFRAN) IV Push, sodium chlorideHas listed allergy to sulfaPast history is as stated above followed by Dr. 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 HEENT sclerae is anictericMucous membranes moistNeck veins are not elevatedLungs decreased breath sounds without cracklesCardiac exam irregularly irregular variable intensity S1 normal S2Abdomen is soft obese and nontenderExtremities chronic venous stasis changes more prominent in the leftPulses 2+ equal symmetricNeurologically nonfocalECG shows atrial fibrillation with a controlled ventricular response and nonspecific ST segment changesLaboratory data:  BUN 15 creatinine 0.98 potassium 4.3 INR 1.5 hematocrit 52.8Chest x-ray no infiltrates or effusionsImpression:  Patient is a 67 year old gentleman with a history DVT on anticoagulation who presents with fever chills dyspnea and  found to have small bilateral pulmonary embolisms.  He was also incidentally noted to be in atrial fibrillation.  He truly is asymptomatic.  Suspect it is secondary to PE we will infectious etiology.  Interestingly his heart rate is controlled not on any AV nodal blocking agents suggestive of some underlying conduction diseasePlan: Given his lowish blood pressure probably could hold his amlodipineWould obviously continue his anticoagulation aiming for therapeutic levelsWould obtain a 2D echocardiogram to assess his biventricular function and PA pressuresContinue treatment for question infection per Medicine/IDDiscussed with the patient and hospitalist

## 2023-09-28 NOTE — Plan of Care
 Plan of Care Overview/ Patient Status    Problem: Adult Inpatient Plan of CareGoal: Readiness for Transition of CareOutcome: Interventions implemented as appropriate Per care team during multidisciplinary rounds, patient is not medically ready to discharge today.  Hematology and ID consults pending. CM will continue to follow.Case Management will take lead to arrange post -acute care services and continue to work with the care team as the patient progresses towards discharge.Raymon Mutton, BSN, RNCase ManagerWesterly [phone]

## 2023-09-28 NOTE — Pharmacy Discharge Note
 High Cost Medication Assessment Consult - PRIOR AUTHORIZATIONInsurance Plan: BCBSPatient's Pharmacy: [facility] [phone] - [place], [place] - [address] [facility] [address]The Following Medication Requires a Prior Authorization:Medication: Eliquis  Dose: 10mg  twice daily x7 days, then 5mg  twice daily thereafterNumber of Units / Day Supply: #74/30 DaysDiagnosis/ICD-10 code: i26.99 Pulmonary EmbolismPrior Medications Tried/Failed: WarfarinPrior Authorization Submitted Via: CoverMyMedsPrior Authorization Key:  [id number] Authorization Status: Approved Through 11/7/2025Estimated Patient Copay: $25 / monthInformation Relayed To: Psychologist, sport and exercise / Treatment TeamNOTES/RECOMMENDATIONSAll prices listed above are estimates for high-cost medications and may not take into account individual plan specifications related to patient premium, deductible, etc Thank Maryelizabeth Rowan, PharmD11/05/2023 12:40 PM_____________________________________________________________Transitions of Care (TOC) Pharmacist Consult Consult took place on 09/28/2023 Referral Source: Medical Team Reason: Patient Counseling for New MedicationMedication(s): EliquisPatient education provided: Reason for medication useMedication administration instructionsMedication adverse effectsMedication interactions - (If Applicable)Safety precautions, and when to seek medical attention Patient counseled: Patient demonstrates and verbalizes understanding of the medication regimen. and Patient was also able to answer questions about specific medications correctly.Handout(s) provided include: Eliquis IMSP Handout + Coupons Manufacturer coupon provided: Apixaban (Eliquis). 16-month free trial available. Manufacturer coupon reduces copay to $10/mo with savings up to $6400/year for up to 2 years for commercially insured patients. (https://[email or web address]?src=MIRHOS#)Assessment/Recommendations:Patient has been long term anticoagulated with warfarin. Patient is familiar with and aware of bleeding and clotting risks.Emphasized importance of twice daily dosing.Delbert Harness, PharmD Transitions of Care Pharmacy Services11/05/2023 4:16 PMContact via Secure Chat Group [facility] Transition of Care [place]) Pharmacist

## 2023-09-28 NOTE — Progress Notes
 General Medicine Progress NotePatient Data:  Patient Name: Andrew [name] Age: 67 y.o. DOB: 10/30/1956	 MRN: [id number]	 Hospital Medicine Progress Andrew [name], MDSubjective: Subjective: afebrile. Tmax 99.1 F. Andrew [name] visiting at bedside. Patient feels better. Had a good night's sleep. 14 point review of systems otherwise negative except as mentioned aboveReview of Allergies/Meds/Hx: I have reviewed the patient's allergies, prior to admission meds, past medical/surgical, family and social hx. Inpatient Medications:Scheduled Meds:Current Facility-Administered Medications Medication Dose Route Frequency Provider Last Rate Last Admin  empagliflozin (JARDIANCE) tablet 10 mg  10 mg Oral Daily Holland Falling Elly Modena, MD   10 mg at 09/28/23 8657  insulin lispro (Admelog, HumaLOG) Correction Scale 1-18 Units  1-18 Units Subcutaneous TID AC Wilson, Mady Haagensen, MD   1 Units at 09/28/23 8469  lisinopriL (PRINIVIL,ZESTRIL) tablet 10 mg  10 mg Oral Daily Holland Falling Elly Modena, MD   10 mg at 09/28/23 0808  piperacillin-tazobactam (ZOSYN) 4.5 g in dextrose iso-osmotic 100 mL IVPB  4.5 g Intravenous Q6H Belinda Fisher, MD 33.3 mL/hr at 09/28/23 0815 4.5 g at 09/28/23 0815  polyethylene glycol (MIRALAX) packet 17 g  17 g Oral BID Holland Falling Elly Modena, MD   17 g at 09/28/23 6295  sodium chloride 0.9 % flush 3 mL  3 mL IV Push Q8H Wilson, Mina Elly Modena, MD     Continuous Infusions:PRN Meds:.acetaminophen, dextrose (GLUCOSE) oral liquid 15 g **OR** fruit juice **OR** skim milk, dextrose (GLUCOSE) oral liquid 30 g **OR** fruit juice, dextrose injection, dextrose injection, glucagon, ondansetron **OR** ondansetron (ZOFRAN) IV Push, sodium chlorideObjective: Vitals:Last 24 hours: Temp:  [97.7 ?F (36.5 ?C)-99.1 ?F (37.3 ?C)] 98.1 ?F (36.7 ?C)Pulse:  [78-95] 94Resp:  [17-21] 18BP: (96-132)/(63-94) 98/63SpO2:  [95 %-98 %] 96 %I/O's:I/O last 3 completed shifts:In: 960 [P.O.:960]Out: 1100 [Urine:1100]No intake/output data recorded.Height, Weight, BSA, BMIWeight: 108.1 kgHeight: 6' 1 (185.4 cm)  Physical ExamConstitutional:     General: He is not in acute distress.   Appearance: He is well-developed. HENT:    Head: Normocephalic and atraumatic. Eyes:    Conjunctiva/sclera: Conjunctivae normal. Neck:    Vascular: No JVD. Cardiovascular:    Rate and Rhythm: Normal rate and regular rhythm.    Heart sounds: Normal heart sounds. No murmur heard.Pulmonary:    Effort: Pulmonary effort is normal.    Breath sounds: Normal breath sounds. No wheezing or rales. Abdominal:    General: Bowel sounds are normal. There is no distension.    Palpations: Abdomen is soft.    Tenderness: There is no abdominal tenderness. Musculoskeletal:       General: Normal range of motion.    Cervical back: Normal range of motion and neck supple.    Right lower leg: No edema.    Left lower leg: No edema. Skin:   General: Skin is warm and dry. Neurological:    General: No focal deficit present.    Mental Status: He is alert.    Motor: No weakness or abnormal muscle tone. Labs:CBCRecent Labs Lab 11/06/241016 11/07/240529 WBC 4.2 6.7 HGB 17.2* 17.2* HCT 51.20* 52.80* PLT 162 163 LFTsRecent Labs Lab 11/06/241016 11/07/240529 ALT 56 46 AST 45* 26 ALKPHOS 49 48 BILITOT 0.7 1.8* LIPASENo results for input(s): LIPASE in the last 168 hours. BMPRecent Labs Lab 11/06/241016 11/07/240529 NA 136 139 K 4.4 4.3 CL 106 106 CO2 24 26 BUN 13 15 CREATININE 1.06 0.98 CALCIUM 9.0 8.9 CoagsRecent Labs Lab 11/06/241016 11/06/241731 11/06/241731 11/07/240529 INR 3.45* 1.87*  --  1.49* PTT  --  33.0*   < > 27.0  < > = values in this interval not displayed.  Cardiac EnzymesNo results for input(s): TROPONINI, CKTOTAL, CKMB in the last 168 hours.Invalid input(s): LNK, TROPONINPOC pBNPNo components found for: PROBNP LipidsNo results found for: CHOL, HDL, LDL, TRIG A1CLab Results Component Value Date  HGBA1C 5.9 09/27/2023  Other Labs No results for input(s): TSH in the last 168 hours.GlucoseRecent Labs Lab 11/06/241016 11/06/241654 11/06/242149 11/07/240529 11/07/240707 GLU 190* 120* 140* 124* 161*  MicrobiologyBlood cultures: Lab Results Component Value Date  LABBLOO No Growth to Date 09/27/2023  LABBLOO No Growth to Date 09/27/2023  LABBLOO No Growth to Date 09/27/2023 Respiratory cultures:@LASTMICRO ([id number]:5)@Wound  cultures:@LASTMICRO (WUJ8119:1)@ C-Reactive Protein Date Value Ref Range Status 09/27/2023 <0.30 <0.30 mg/dL Final No results found for: ESRSED Diagnostics:I have reviewed the patient's Radiology report(s) within the last 48 hrs.Assessment/Plan: Andrew [name] is a 67 y.o. 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 Per discussion with hematologist Dr Rico Junker, this could provide a window for clot formation- for [facility], we will use lovenox with plans to transition to DOAC pending echo, [place] A/P. Discussed with Dr Rico Junker Supratherapeutic INR- resolved ChillsSweating- CTA no infectious source. - [place] A/P ordered for today. Tbil elevated- Xray abd showing no perforation. Constipation noted. Miralax BID ordered. Abx as below.- Lyme, anaplasma, Ehrlichia and Babesia testing- follow up on blood cultures, so far NGTD- resp viral panel- zosyn for now- ID consult New onset Afib- lovenox, plan to transition to eliquis - echo- cardiology consult appreciated  DM- SSI  Diet: Diet Cardiac Consistent CarbohydrateVTE prophylaxis: lovenoxCode status: Full CodeCare/plan discussed with patient at the bedside, who understands and agrees with the above plan.I confirmed that the patient?s Advance Care Plan is present, code status is documented, or surrogate decision maker is listed in the patient?s medical record.I have utilized all available immediate resources to obtain, update, or review the patient?s current medications.Signed:Mina Andrey Campanile, MDBeeper: [phone]/7/2024This report was created in part from templates and voice recognition software. Typographical and minor dictation errors may be present.

## 2023-09-28 NOTE — Plan of Care
 Plan of Care Overview/ Patient Status    Assumed care of pt at 0700. Pt is A+O x4, ambulating independently in room. Monitored on telemetry maintaining afib rhythm 80s-100s, tachycardia up to 140s with exertion. Pt down for echo and [place] this shift. IV ABX administered per MAR. BS monitored ACHS, SSI administered per MAR. VSS, safety maintained, call light within reach. Problem: Adult Inpatient Plan of CareGoal: Optimal Comfort and WellbeingOutcome: Interventions implemented as appropriate Problem: Fall Injury RiskGoal: Absence of Fall and Fall-Related InjuryOutcome: Interventions implemented as appropriate Problem: InfectionGoal: Absence of Infection Signs and SymptomsOutcome: Interventions implemented as appropriate

## 2023-09-28 NOTE — Plan of Care
 Problem: Diabetes ComorbidityGoal: Blood Glucose Level Within Targeted RangeOutcome: Interventions implemented as appropriate Problem: Adult Inpatient Plan of CareGoal: Plan of Care ReviewOutcome: Interventions implemented as appropriateGoal: Patient-Specific Goal (Individualized)Outcome: Interventions implemented as appropriateGoal: Absence of Hospital-Acquired Illness or InjuryOutcome: Interventions implemented as appropriateGoal: Optimal Comfort and WellbeingOutcome: Interventions implemented as appropriateGoal: Readiness for Transition of CareOutcome: Interventions implemented as appropriate Problem: Fall Injury RiskGoal: Absence of Fall and Fall-Related InjuryOutcome: Interventions implemented as appropriate Problem: Wound Healing ProgressionGoal: Optimal Wound HealingOutcome: Interventions implemented as appropriate Problem: InfectionGoal: Absence of Infection Signs and SymptomsOutcome: Interventions implemented as appropriate Plan of Care Overview/ Patient Status   Assumed care of pt at 1900. Pt is A&Ox4, independent in room. On tele- in a-fib 70-80s at rest. Pt up to 160 with ambulation but asymptomatic and recovers quickly. IV zosyn infused as ordered. Resp panel resulted negative and precautions discontinued. VSS. Safety maintained.

## 2023-09-29 LAB — LYME ANTIBODIES, IGG/IGM CONFIRMATION (YH) (LAB ORDER ONLY)
BKR LYME IGG ANTIBODY: NEGATIVE
BKR LYME IGM ANTIBODY: NEGATIVE

## 2023-09-29 LAB — COMPREHENSIVE METABOLIC PANEL
BKR ALANINE AMINOTRANSFERASE (ALT): 37 U/L (ref 12–78)
BKR ALBUMIN: 3.4 g/dL (ref 3.4–5.0)
BKR ALKALINE PHOSPHATASE: 41 U/L — ABNORMAL LOW (ref 45–117)
BKR ANION GAP (LM): 7 mmol/L (ref 5–15)
BKR ASPARTATE AMINOTRANSFERASE (AST): 25 U/L (ref 15–37)
BKR BILIRUBIN TOTAL: 1.2 mg/dL — ABNORMAL HIGH (ref 0.2–1.0)
BKR BLOOD UREA NITROGEN: 15 mg/dL (ref 7–18)
BKR CALCIUM: 8 mg/dL — ABNORMAL LOW (ref 8.5–10.1)
BKR CHLORIDE: 106 mmol/L (ref 98–107)
BKR CO2: 26 mmol/L (ref 21–32)
BKR CREATININE: 0.94 mg/dL (ref 0.70–1.30)
BKR EGFR, CREATININE (CKD-EPI 2021): >60 mL/min/{1.73_m2} (ref >=60)
BKR GLOBULIN: 3.2 g/dL (ref 2.5–5.0)
BKR GLUCOSE: 116 mg/dL — ABNORMAL HIGH (ref 65–110)
BKR POTASSIUM: 4.1 mmol/L (ref 3.5–5.1)
BKR PROTEIN TOTAL: 6.6 g/dL (ref 6.4–8.2)
BKR SODIUM: 139 mmol/L (ref 136–145)

## 2023-09-29 LAB — PT/INR AND PTT (BH GH L LMW YH)
BKR INR: 1.21 — ABNORMAL HIGH (ref 0.89–1.18)
BKR PARTIAL THROMBOPLASTIN TIME: 30.1 s (ref 23.0–31.0)
BKR PROTHROMBIN TIME: 12.2 s — ABNORMAL HIGH (ref 9.3–11.9)

## 2023-09-29 LAB — CBC WITH AUTO DIFFERENTIAL
BKR WAM ABSOLUTE IMMATURE GRANULOCYTES.: 0.02 x 1000/ÂµL (ref 0.00–0.30)
BKR WAM ABSOLUTE LYMPHOCYTE COUNT.: 1.35 x 1000/ÂµL (ref 0.60–3.70)
BKR WAM ABSOLUTE NRBC (2 DEC): 0 x 1000/ÂµL (ref 0.00–1.00)
BKR WAM ANC (ABSOLUTE NEUTROPHIL COUNT): 3.07 x 1000/ÂµL (ref 2.00–7.60)
BKR WAM BASOPHIL ABSOLUTE COUNT.: 0.06 x 1000/ÂµL (ref 0.00–1.00)
BKR WAM BASOPHILS: 1.1 % (ref 0.0–1.4)
BKR WAM EOSINOPHIL ABSOLUTE COUNT.: 0.09 x 1000/ÂµL (ref 0.00–1.00)
BKR WAM EOSINOPHILS: 1.7 % (ref 0.0–5.0)
BKR WAM HEMATOCRIT (2 DEC): 48.1 % (ref 38.50–50.00)
BKR WAM HEMOGLOBIN: 15.7 g/dL (ref 13.2–17.1)
BKR WAM IMMATURE GRANULOCYTES: 0.4 % (ref 0.0–1.0)
BKR WAM LYMPHOCYTES: 25.3 % (ref 17.0–50.0)
BKR WAM MCH (PG): 30.4 pg (ref 27.0–33.0)
BKR WAM MCHC: 32.6 g/dL (ref 31.0–36.0)
BKR WAM MCV: 93 fL (ref 80.0–100.0)
BKR WAM MONOCYTE ABSOLUTE COUNT.: 0.75 x 1000/ÂµL (ref 0.00–1.00)
BKR WAM MONOCYTES: 14 % — ABNORMAL HIGH (ref 4.0–12.0)
BKR WAM MPV: 9.6 fL (ref 8.0–12.0)
BKR WAM NEUTROPHILS: 57.5 % (ref 39.0–72.0)
BKR WAM NUCLEATED RED BLOOD CELLS: 0 % (ref 0.0–1.0)
BKR WAM PLATELETS: 136 x1000/ÂµL — ABNORMAL LOW (ref 150–420)
BKR WAM RDW-CV: 14 % (ref 11.0–15.0)
BKR WAM RED BLOOD CELL COUNT.: 5.17 M/ÂµL (ref 4.00–6.00)
BKR WAM WHITE BLOOD CELL COUNT: 5.3 x1000/ÂµL (ref 4.0–11.0)

## 2023-09-29 LAB — ERYTHROPOIETIN: ERYTHROPOIETIN (EPO), S: 9.5 m[IU]/mL (ref 2.6–18.5)

## 2023-09-29 MED ORDER — APIXABAN 5 MG TABLET
5 | Freq: Two times a day (BID) | ORAL | Status: DC
Start: 2023-09-29 — End: 2023-10-01

## 2023-09-29 MED ORDER — APIXABAN 5 MG TABLET
5 | Freq: Two times a day (BID) | ORAL | Status: DC
Start: 2023-09-29 — End: 2023-10-01
  Administered 2023-09-30 (×2): 5 mg via ORAL

## 2023-09-29 NOTE — Progress Notes
 Patient continues in AFib without symptoms of palpitations or chest pains.  Offers he slept better last evening.Scheduled Meds:Current Facility-Administered Medications Medication Dose Route Frequency Provider Last Rate Last Admin  empagliflozin (JARDIANCE) tablet 10 mg  10 mg Oral Daily Holland Falling Elly Modena, MD   10 mg at 09/28/23 0808  enoxaparin (LOVENOX) injection 100 mg  100 mg Subcutaneous Q12H Holland Falling Elly Modena, MD   100 mg at 09/28/23 2111  insulin lispro (Admelog, HumaLOG) Correction Scale 1-18 Units  1-18 Units Subcutaneous TID AC Wilson, Mady Haagensen, MD   1 Units at 09/28/23 4782  lisinopriL (PRINIVIL,ZESTRIL) tablet 10 mg  10 mg Oral Daily Holland Falling Elly Modena, MD   10 mg at 09/28/23 0808  piperacillin-tazobactam (ZOSYN) 4.5 g in dextrose iso-osmotic 100 mL IVPB  4.5 g Intravenous Q6H Holland Falling Elly Modena, MD 33.3 mL/hr at 09/29/23 0311 4.5 g at 09/29/23 0311  polyethylene glycol (MIRALAX) packet 17 g  17 g Oral BID Holland Falling Elly Modena, MD   17 g at 09/28/23 9562  sodium chloride 0.9 % flush 3 mL  3 mL IV Push Q8H Holland Falling Elly Modena, MD   3 mL at 09/28/23 2111 Continuous Infusions:PRN Meds:.acetaminophen, dextrose (GLUCOSE) oral liquid 15 g **OR** fruit juice **OR** skim milk, dextrose (GLUCOSE) oral liquid 30 g **OR** fruit juice, dextrose injection, dextrose injection, glucagon, ondansetron **OR** ondansetron (ZOFRAN) IV Push, perflutren lipid microspheres (DEFINITY) 1.3 mL in sodium chloride 0.9% PF 8.7 mL injection, sodium chlorideAppears in no distressBP 99/68  - Pulse 89  - Temp 97.8 ?F (36.6 ?C) (Temporal)  - Resp 16  - Ht 6' 1 (1.854 m)  - Wt 107.9 kg  - SpO2 99%  - BMI 31.38 kg/m? Mucous membranes are moistNeck veins are not elevatedLungs are clearCardiac exam irregularly irregularAbdomen is softExtremities no pedal edemaTelemetry reviewed continues in AFibEchocardiogram shows normal LV function no significant valvular abnormalities right heart function appears normalImpression:  AFib persisting, asymptomaticBilateral pulmonary embolism could be etiologyDiabetesRecommendations:  Per hematology patient is being transitioned to oral DOACGiven ongoing low blood pressure question whether lisinopril dose should be loweredHopefully patient will return to his living environment soonWe did discuss with him consideration of rhythm control but we both agreed on holding off on that for now and would be happy to discuss as an outpatient if AFib persists

## 2023-09-29 NOTE — Plan of Care
 Plan of Care Overview/ Patient Status    Assumed care @ 0700. A&OX4. Lovenox continued this morning, to be transitioned to eliquis tonight. On tele, afib in 80s, sometimes 130s with ambulation but remains asymptomatic. Pt showered today. IV zosyn continued. Vss. Call light within reach.Problem: Diabetes ComorbidityGoal: Blood Glucose Level Within Targeted RangeOutcome: Interventions implemented as appropriate Problem: Adult Inpatient Plan of CareGoal: Plan of Care ReviewOutcome: Interventions implemented as appropriateGoal: Patient-Specific Goal (Individualized)Outcome: Interventions implemented as appropriateGoal: Absence of Hospital-Acquired Illness or InjuryOutcome: Interventions implemented as appropriateGoal: Optimal Comfort and WellbeingOutcome: Interventions implemented as appropriateGoal: Readiness for Transition of CareOutcome: Interventions implemented as appropriate Problem: Fall Injury RiskGoal: Absence of Fall and Fall-Related InjuryOutcome: Interventions implemented as appropriate Problem: Wound Healing ProgressionGoal: Optimal Wound HealingOutcome: Interventions implemented as appropriate Problem: InfectionGoal: Absence of Infection Signs and SymptomsOutcome: Interventions implemented as appropriate Problem: DysrhythmiaGoal: Normalized Cardiac RhythmOutcome: Interventions implemented as appropriate Problem: VTE (Venous Thromboembolism)Goal: VTE (Venous Thromboembolism) Symptom ResolutionOutcome: Interventions implemented as appropriate

## 2023-09-29 NOTE — Plan of Care
 Problem: Diabetes ComorbidityGoal: Blood Glucose Level Within Targeted RangeOutcome: Interventions implemented as appropriate Problem: Adult Inpatient Plan of CareGoal: Plan of Care ReviewOutcome: Interventions implemented as appropriateGoal: Patient-Specific Goal (Individualized)Outcome: Interventions implemented as appropriateGoal: Absence of Hospital-Acquired Illness or InjuryOutcome: Interventions implemented as appropriateGoal: Optimal Comfort and WellbeingOutcome: Interventions implemented as appropriateGoal: Readiness for Transition of CareOutcome: Interventions implemented as appropriate Problem: Fall Injury RiskGoal: Absence of Fall and Fall-Related InjuryOutcome: Interventions implemented as appropriate Problem: Wound Healing ProgressionGoal: Optimal Wound HealingOutcome: Interventions implemented as appropriate Problem: InfectionGoal: Absence of Infection Signs and SymptomsOutcome: Interventions implemented as appropriate Plan of Care Overview/ Patient Status    Assumed care of pt at 1900. Pt is A&Ox4, independent in room. Continues on IV zosyn. Tele showing a-fib 70-80, up to 150 with ambulation, asymptomatic. No complaints offered. VSS. Safety maintained.

## 2023-09-29 NOTE — Plan of Care
 Plan of Care Overview/ Patient Status    Problem: Adult Inpatient Plan of CareGoal: Readiness for Transition of CareOutcome: Interventions implemented as appropriate Patient independent in room per nursing notes. No CM needs identified. Met with patient at bedside to review IMM and discharge planning.  Patient verbalizes understanding of rights under Medicare to appeal discharge should not feel ready. Copy of IMM sent to patient's MyChart. Patient endorses agreement with plan to discharge home with MD follow-up and no skilled services with his wife providing transport.  Per care team during multidisciplinary rounds, patient is anticipated to discharge tomorrow. CM will continue to follow.Case Management will take lead to arrange post -acute care services and continue to work with the care team as the patient progresses towards discharge.Raymon Mutton, BSN, RNCase ManagerWesterly [phone]

## 2023-09-29 NOTE — Progress Notes
 General Medicine Progress NotePatient Data:  Patient Name: Andrew [name] Age: 67 y.o. DOB: 1956-02-12	 MRN: [id number]	 Hospital Medicine Progress Juanna Cao APRNSubjective: Subjective:  Patient awake, alert, reports slept very well last night.  Denies any shortness breath, cough, congestion, significant chest or pleuritic pain.  No nausea or vomiting or diarrhea14 point review of systems otherwise negative except as mentioned aboveReview of Allergies/Meds/Hx: I have reviewed the patient's allergies, prior to admission meds, past medical/surgical, family and social hx. Inpatient Medications:Scheduled Meds:Current Facility-Administered Medications Medication Dose Route Frequency Provider Last Rate Last Admin  empagliflozin (JARDIANCE) tablet 10 mg  10 mg Oral Daily Holland Falling Elly Modena, MD   10 mg at 09/29/23 0906  enoxaparin (LOVENOX) injection 100 mg  100 mg Subcutaneous Q12H Holland Falling Elly Modena, MD   100 mg at 09/29/23 0911  insulin lispro (Admelog, HumaLOG) Correction Scale 1-18 Units  1-18 Units Subcutaneous TID AC Wilson, Mady Haagensen, MD   1 Units at 09/28/23 4010  lisinopriL (PRINIVIL,ZESTRIL) tablet 10 mg  10 mg Oral Daily Holland Falling Elly Modena, MD   10 mg at 09/29/23 0907  piperacillin-tazobactam (ZOSYN) 4.5 g in dextrose iso-osmotic 100 mL IVPB  4.5 g Intravenous Q6H Holland Falling Elly Modena, MD 33.3 mL/hr at 09/29/23 0907 4.5 g at 09/29/23 0907  polyethylene glycol (MIRALAX) packet 17 g  17 g Oral BID Holland Falling Elly Modena, MD   17 g at 09/28/23 2725  sodium chloride 0.9 % flush 3 mL  3 mL IV Push Q8H Holland Falling Elly Modena, MD   3 mL at 09/28/23 2111 Continuous Infusions:PRN Meds:.acetaminophen, dextrose (GLUCOSE) oral liquid 15 g **OR** fruit juice **OR** skim milk, dextrose (GLUCOSE) oral liquid 30 g **OR** fruit juice, dextrose injection, dextrose injection, glucagon, ondansetron **OR** ondansetron (ZOFRAN) IV Push, perflutren lipid microspheres (DEFINITY) 1.3 mL in sodium chloride 0.9% PF 8.7 mL injection, sodium chlorideObjective: Vitals:Last 24 hours: Temp:  [97.8 ?F (36.6 ?C)-98.1 ?F (36.7 ?C)] 97.8 ?F (36.6 ?C)Pulse:  [85-92] 89Resp:  [16-17] 16BP: (93-113)/(56-76) 113/76SpO2:  [97 %-99 %] 99 %I/O's:I/O last 3 completed shifts:In: 1020 [P.O.:1020]Out: 1350 [Urine:1350]No intake/output data recorded.Height, Weight, BSA, BMIWeight: 107.9 kgHeight: 6' 1 (185.4 cm)  Physical ExamVitals reviewed. Constitutional:     General: He is not in acute distress.HENT:    Head: Normocephalic.    Nose: No congestion.    Mouth/Throat:    Mouth: Mucous membranes are moist.    Pharynx: Oropharynx is clear. Eyes:    Conjunctiva/sclera: Conjunctivae normal. Cardiovascular:    Rate and Rhythm: Normal rate. Rhythm irregular. Pulmonary:    Effort: Pulmonary effort is normal. No respiratory distress.    Breath sounds: No wheezing, rhonchi or rales.    Comments: Clear upper lobes, diminished basesAbdominal:    General: Bowel sounds are normal. There is no distension.    Palpations: Abdomen is soft.    Tenderness: There is no abdominal tenderness. Musculoskeletal:    Cervical back: Neck supple.    Right lower leg: No edema.    Left lower leg: No edema. Skin:   General: Skin is warm and dry. Neurological:    General: No focal deficit present.    Mental Status: He is alert and oriented to person, place, and time. Mental status is at baseline.    Cranial Nerves: No cranial nerve deficit. Psychiatric:       Mood and Affect: Mood normal.       Behavior: Behavior normal. Labs:CBCRecent Labs Lab 11/06/241016 11/07/240529 11/08/240533 WBC 4.2 6.7 5.3 HGB  17.2* 17.2* 15.7 HCT 51.20* 52.80* 48.10 PLT 162 163 136* LFTsRecent Labs Lab 11/06/241016 11/07/240529 11/08/240533 ALT 56 46 37 AST 45* 26 25 ALKPHOS 49 48 41* BILITOT 0.7 1.8* 1.2* LIPASENo results for input(s): LIPASE in the last 168 hours. BMPRecent Labs Lab 11/06/241016 11/07/240529 11/08/240533 NA 136 139 139 K 4.4 4.3 4.1 CL 106 106 106 CO2 24 26 26  BUN 13 15 15  CREATININE 1.06 0.98 0.94 CALCIUM 9.0 8.9 8.0* CoagsRecent Labs Lab 11/06/241731 11/07/240529 11/08/240533 INR 1.87* 1.49* 1.21* PTT 33.0* 27.0 30.1  Cardiac EnzymesNo results for input(s): TROPONINI, CKTOTAL, CKMB in the last 168 hours.Invalid input(s): LNK, TROPONINPOC pBNPNo components found for: PROBNP LipidsNo results found for: CHOL, HDL, LDL, TRIG A1CLab Results Component Value Date  HGBA1C 5.9 09/27/2023  Other Labs No results for input(s): TSH in the last 168 hours.GlucoseRecent Labs Lab 11/07/241203 11/07/241659 11/07/242042 11/08/240533 11/08/240757 11/08/241141 GLU 105 130* 159* 116* 119* 222*  MicrobiologyBlood cultures: Lab Results Component Value Date  LABBLOO No Growth to Date 09/27/2023  LABBLOO No Growth to Date 09/27/2023  LABBLOO No Growth to Date 09/27/2023 Respiratory cultures:@LASTMICRO (LAB3221:5)@Wound  cultures:@LASTMICRO (ZOX0960:4)@ C-Reactive Protein Date Value Ref Range Status 09/27/2023 <0.30 <0.30 mg/dL Final No results found for: ESRSED Diagnostics:[place] ABDOMEN PELVIS W IV CONTRAST   HISTORY: chills, suspect abdominal source of sepsis. PE on CTA (septic emboli?). COMPARISON: None. V4098 - Total number of known  scans and cardiac nuclear medicine studies within the past 12 months: 1 J1914 - RADIATION DOSE ACQUIRED DURING SCAN: 577.68 mGy.cm.[employer] strives for high quality imaging with the lowest possible radiation dose. For more information on medical radiation exposure please visit: [email or web address] . TECHNIQUE: Contiguous axial images of the abdomen and pelvis were obtained after the administration of 75 cc Omnipaque 350.  Oral contrast was not administered.  Sagittal and coronal reformatted images were also provided. FINDINGS: The lung bases are clear. The heart is upper normal in size. Abdomen: No hydronephrosis is seen in either kidney. There is no enhancing renal mass. There are no renal calculi. There is bilateral trace, nonspecific perinephric fluid. There is mild diffuse hepatic pseudocyst. The Gallbladder, spleen, pancreas, and adrenal glands are normal in appearance. The abdominal aorta is normal in size and demonstrates scattered atherosclerotic calcifications. There are scattered tiny, subcentimeter retroperitoneal lymph nodes and scattered mesenteric lymph nodes, consistent with benign reactive disease. The stomach is unremarkable. The small bowel is normal in caliber. The region of the terminal ileum is normal. The appendix is normal in size and demonstrates an appendicolith. The colon is normal in caliber. Pelvis: The bladder is smoothly contoured. The prostate gland is normal size. There is no free fluid. There is a small fat-containing left inguinal hernia. Musculoskeletal: No lytic or blastic lesion is seen. IMPRESSION: 1.  No acute abdominal process is seen. 2.  Incidental findings include trace, nonspecific bilateral perinephric fluid, hepatic steatosis, and small fat-containing left inguinal hernia.  Radiation dose lowering was achieved by the use of an iterative reconstruction technique. PQRS:            N8295  [facility] Notification System Classification: Routine. Reported and signed by:  Renee Harder, MDEchocardiagram 09/28/2023 * Left ventricular function is within normal limits estimated at 65% with mild concentric left ventricular hypertrophyNormal right ventricular functionValves appear structurally normal with no regurgitation noted by Doppler evaluation therefore can not estimate pulmonary artery systolic pressureAtria of normal size The aortic root and ascending aorta are mildly dilatedThere is no pericardial effusion There is no  previous echo to compareAssessment/Plan: Tyrone Mesmer is a 67 y.o. 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 Per discussion with hematology by previous provider, this could provide a window for clot formation-Started on full Dose Lovenox yesterday- plan in place  to transition to Eliquis starting this evening-DC Lovenox-Seen by [facility] pharmacy w/ Adequate Coverage for Apixaban Supratherapeutic INR- resolved Chills w/Sweating- CTA no infectious source. - [place] A/P completed on 11/7 and shows no acute process. -T bili remains slightly elevated but improved.- Xray abd showing no perforation. Constipation noted. -Continue Miralax BID - Lyme AB indeterminate-IGG/IGM obtained and pending.-Consider empiric Doxy pending results-Anaplasma, Ehrlichia and Babesia negative- follow up on blood cultures, so far NGTD- will DC Zosyn in AM 11/9 if no change in cultures- resp viral panel negative- Seen by ID-appreciate assistance  New onset Afib-  started full dose lovenox on 11/7, plan to transition to eliquis - echo completed and unremarkable w/ EF 66%-No valvular disease seen- Seen by cardiology-assistance appreciated  DM-A1c 5.9-Continue Jardiance- SSI Coverage  Diet: Diet Cardiac Consistent CarbohydrateVTE prophylaxis: lovenox-->EliquisCode status: Full Code Care/plan discussed with patient at the bedside, who understands and agrees with the above plan. Signed:Yanel Dombrosky Vanna Scotland APRN11/8/202412:25This report was created in part from templates and voice recognition software. Typographical and minor dictation errors may be present.

## 2023-09-30 DIAGNOSIS — I10 Essential (primary) hypertension: Secondary | ICD-10-CM

## 2023-09-30 DIAGNOSIS — Z7984 Long term (current) use of oral hypoglycemic drugs: Secondary | ICD-10-CM

## 2023-09-30 DIAGNOSIS — D696 Thrombocytopenia, unspecified: Secondary | ICD-10-CM

## 2023-09-30 DIAGNOSIS — Z86718 Personal history of other venous thrombosis and embolism: Secondary | ICD-10-CM

## 2023-09-30 DIAGNOSIS — D6869 Other thrombophilia: Secondary | ICD-10-CM

## 2023-09-30 DIAGNOSIS — Z6831 Body mass index (BMI) 31.0-31.9, adult: Secondary | ICD-10-CM

## 2023-09-30 DIAGNOSIS — Z7901 Long term (current) use of anticoagulants: Secondary | ICD-10-CM

## 2023-09-30 DIAGNOSIS — Z882 Allergy status to sulfonamides status: Secondary | ICD-10-CM

## 2023-09-30 DIAGNOSIS — Z8616 Personal history of COVID-19: Secondary | ICD-10-CM

## 2023-09-30 DIAGNOSIS — I2699 Other pulmonary embolism without acute cor pulmonale: Secondary | ICD-10-CM

## 2023-09-30 DIAGNOSIS — E66811 Obesity, class 1: Secondary | ICD-10-CM

## 2023-09-30 DIAGNOSIS — I4891 Unspecified atrial fibrillation: Secondary | ICD-10-CM

## 2023-09-30 DIAGNOSIS — E119 Type 2 diabetes mellitus without complications: Secondary | ICD-10-CM

## 2023-09-30 DIAGNOSIS — K5900 Constipation, unspecified: Secondary | ICD-10-CM

## 2023-09-30 LAB — CBC WITH AUTO DIFFERENTIAL
BKR WAM ABSOLUTE IMMATURE GRANULOCYTES.: 0.02 x 1000/ÂµL (ref 0.00–0.30)
BKR WAM ABSOLUTE LYMPHOCYTE COUNT.: 1.32 x 1000/ÂµL (ref 0.60–3.70)
BKR WAM ABSOLUTE NRBC (2 DEC): 0 x 1000/ÂµL (ref 0.00–1.00)
BKR WAM ANC (ABSOLUTE NEUTROPHIL COUNT): 2.98 x 1000/ÂµL (ref 2.00–7.60)
BKR WAM BASOPHIL ABSOLUTE COUNT.: 0.04 x 1000/ÂµL (ref 0.00–1.00)
BKR WAM BASOPHILS: 0.8 % (ref 0.0–1.4)
BKR WAM EOSINOPHIL ABSOLUTE COUNT.: 0.09 x 1000/ÂµL (ref 0.00–1.00)
BKR WAM EOSINOPHILS: 1.8 % (ref 0.0–5.0)
BKR WAM HEMATOCRIT (2 DEC): 49.2 % (ref 38.50–50.00)
BKR WAM HEMOGLOBIN: 16.4 g/dL (ref 13.2–17.1)
BKR WAM IMMATURE GRANULOCYTES: 0.4 % (ref 0.0–1.0)
BKR WAM LYMPHOCYTES: 25.8 % (ref 17.0–50.0)
BKR WAM MCH (PG): 30.6 pg (ref 27.0–33.0)
BKR WAM MCHC: 33.3 g/dL (ref 31.0–36.0)
BKR WAM MCV: 91.8 fL (ref 80.0–100.0)
BKR WAM MONOCYTE ABSOLUTE COUNT.: 0.66 x 1000/ÂµL (ref 0.00–1.00)
BKR WAM MONOCYTES: 12.9 % — ABNORMAL HIGH (ref 4.0–12.0)
BKR WAM MPV: 10.2 fL (ref 8.0–12.0)
BKR WAM NEUTROPHILS: 58.3 % (ref 39.0–72.0)
BKR WAM NUCLEATED RED BLOOD CELLS: 0 % (ref 0.0–1.0)
BKR WAM PLATELETS: 136 x1000/ÂµL — ABNORMAL LOW (ref 150–420)
BKR WAM RDW-CV: 13.8 % (ref 11.0–15.0)
BKR WAM RED BLOOD CELL COUNT.: 5.36 M/ÂµL (ref 4.00–6.00)
BKR WAM WHITE BLOOD CELL COUNT: 5.1 x1000/ÂµL (ref 4.0–11.0)

## 2023-09-30 LAB — COMPREHENSIVE METABOLIC PANEL
BKR ALANINE AMINOTRANSFERASE (ALT): 39 U/L (ref 12–78)
BKR ALBUMIN: 3.6 g/dL (ref 3.4–5.0)
BKR ALKALINE PHOSPHATASE: 42 U/L — ABNORMAL LOW (ref 45–117)
BKR ANION GAP (LM): 9 mmol/L (ref 5–15)
BKR ASPARTATE AMINOTRANSFERASE (AST): 25 U/L (ref 15–37)
BKR BILIRUBIN TOTAL: 1 mg/dL (ref 0.2–1.0)
BKR BLOOD UREA NITROGEN: 11 mg/dL (ref 7–18)
BKR CALCIUM: 8.6 mg/dL (ref 8.5–10.1)
BKR CHLORIDE: 106 mmol/L (ref 98–107)
BKR CO2: 22 mmol/L (ref 21–32)
BKR CREATININE: 0.93 mg/dL (ref 0.70–1.30)
BKR EGFR, CREATININE (CKD-EPI 2021): >60 mL/min/{1.73_m2} (ref >=60)
BKR GLOBULIN: 3.3 g/dL (ref 2.5–5.0)
BKR GLUCOSE: 138 mg/dL — ABNORMAL HIGH (ref 65–110)
BKR POTASSIUM: 3.7 mmol/L (ref 3.5–5.1)
BKR PROTEIN TOTAL: 6.9 g/dL (ref 6.4–8.2)
BKR SODIUM: 137 mmol/L (ref 136–145)

## 2023-09-30 LAB — PT/INR AND PTT (BH GH L LMW YH)
BKR INR: 1.1 (ref 0.89–1.18)
BKR PARTIAL THROMBOPLASTIN TIME: 25.4 s (ref 23.0–31.0)
BKR PROTHROMBIN TIME: 11.1 s (ref 9.3–11.9)

## 2023-09-30 MED ORDER — APIXABAN 5 MG TABLET
5 mg | ORAL_TABLET | Freq: Two times a day (BID) | ORAL | 1 refills | Status: AC
Start: 2023-09-30 — End: 2023-09-30

## 2023-09-30 MED ORDER — APIXABAN 5 MG TABLET
5 | ORAL_TABLET | ORAL | 1 refills | Status: AC
Start: 2023-09-30 — End: ?

## 2023-09-30 NOTE — Plan of Care
 Problem: Diabetes ComorbidityGoal: Blood Glucose Level Within Targeted RangeOutcome: Interventions implemented as appropriate Problem: Adult Inpatient Plan of CareGoal: Plan of Care ReviewOutcome: Interventions implemented as appropriateGoal: Patient-Specific Goal (Individualized)Outcome: Interventions implemented as appropriateGoal: Absence of Hospital-Acquired Illness or InjuryOutcome: Interventions implemented as appropriateGoal: Optimal Comfort and WellbeingOutcome: Interventions implemented as appropriateGoal: Readiness for Transition of CareOutcome: Interventions implemented as appropriate Problem: Fall Injury RiskGoal: Absence of Fall and Fall-Related InjuryOutcome: Interventions implemented as appropriate Problem: Wound Healing ProgressionGoal: Optimal Wound HealingOutcome: Interventions implemented as appropriate Problem: InfectionGoal: Absence of Infection Signs and SymptomsOutcome: Interventions implemented as appropriate Problem: DysrhythmiaGoal: Normalized Cardiac RhythmOutcome: Interventions implemented as appropriate Problem: VTE (Venous Thromboembolism)Goal: VTE (Venous Thromboembolism) Symptom ResolutionOutcome: Interventions implemented as appropriate Plan of Care Overview/ Patient Status    Assumed care of patient @1500 . No complaints offered. BG readings monitored. Medicated a/o. Call light within reach. Safety maintained.

## 2023-09-30 NOTE — Progress Notes
 Patient ambulating continues without palpitations feeling better and strongerScheduled Meds:Current Facility-Administered Medications Medication Dose Route Frequency Provider Last Rate Last Admin  apixaban (ELIQUIS) tablet 10 mg  10 mg Oral Q12H Rise Mu, APRN   10 mg at 09/29/23 2056  Followed by  Melene Muller ON 10/06/2023] apixaban (ELIQUIS) tablet 5 mg  5 mg Oral Q12H Rise Mu, APRN      empagliflozin (JARDIANCE) tablet 10 mg  10 mg Oral Daily Holland Falling Elly Modena, MD   10 mg at 09/29/23 1610  insulin lispro (Admelog, HumaLOG) Correction Scale 1-18 Units  1-18 Units Subcutaneous TID AC Wilson, Mady Haagensen, MD   2 Units at 09/29/23 1230  lisinopriL (PRINIVIL,ZESTRIL) tablet 10 mg  10 mg Oral Daily Holland Falling Elly Modena, MD   10 mg at 09/29/23 0907  piperacillin-tazobactam (ZOSYN) 4.5 g in dextrose iso-osmotic 100 mL IVPB  4.5 g Intravenous Q6H Holland Falling Elly Modena, MD 33.3 mL/hr at 09/30/23 0318 4.5 g at 09/30/23 0318  sodium chloride 0.9 % flush 3 mL  3 mL IV Push Q8H Holland Falling Elly Modena, MD   3 mL at 09/29/23 2106 Continuous Infusions:PRN Meds:.acetaminophen, dextrose (GLUCOSE) oral liquid 15 g **OR** fruit juice **OR** skim milk, dextrose (GLUCOSE) oral liquid 30 g **OR** fruit juice, dextrose injection, dextrose injection, glucagon, ondansetron **OR** ondansetron (ZOFRAN) IV Push, perflutren lipid microspheres (DEFINITY) 1.3 mL in sodium chloride 0.9% PF 8.7 mL injection, sodium chloride Appears well in no distressBP 121/67  - Pulse (!) 93  - Temp 98 ?F (36.7 ?C) (Oral)  - Resp 18  - Ht 6' 1 (1.854 m)  - Wt 107.9 kg  - SpO2 99%  - BMI 31.38 kg/m? Lungs are clearCardiac exam irregularly irregularAbdomen is softExtremities chronic venous stasis changes in the leftTelemetry shows atrial fibrillation with a controlled responseImpression:  AFib persisting continues asymptomaticPulmonary embolism now on EliquisDiabetesRecommendations:  Continue on anticoagulation for PE at higher dose and then standard dose Would continue off amlodipineContinue on lisinopril BP seems better given patient has diabetesWould continue hold off on any rhythm controlWould be happy to discuss as outpatient if AFib persists

## 2023-09-30 NOTE — Plan of Care
 Andrew [name] was discharged via Private Car accompanied by Spouse.  Verbalized understanding of discharge instructionsand recommended follow up care as per the after visit summary.  Written discharge instructions provided. Denies any further questions. Vital signs    Vitals:  09/29/23 2054 09/30/23 0602 09/30/23 0958 09/30/23 1430 BP: 98/65 121/67 111/72 104/68 Pulse: (!) 93 (!) 93 80 85 Resp: 19 18  18  Temp: 98.1 ?F (36.7 ?C) 98 ?F (36.7 ?C)  98.2 ?F (36.8 ?C) TempSrc: Oral Oral  Temporal SpO2: 96% 99%  98% Weight:     Height:     Patient confirmed all belongings returned. Belongings charted in last 7 days: Patient Valuables   Patient Valuables Flowsheet                    PATIENT VALUABLE(S)       Clothing Disposition At bedside/locker/closet 09/27/23 1611  Vision Disposition At bedside/locker/closet 09/27/23 1611   Cell phone disposition At bedside/locker/closet 09/27/23 1611  Jewelry disposition At bedside/locker/closet 09/27/23 1643   Other disposition At bedside/locker/closet 09/27/23 1611     Plan of Care Overview/ Patient Status    Problem: Diabetes ComorbidityGoal: Blood Glucose Level Within Targeted RangeOutcome: Outcome(s) achieved Problem: Adult Inpatient Plan of CareGoal: Plan of Care ReviewOutcome: Outcome(s) achievedGoal: Patient-Specific Goal (Individualized)Outcome: Outcome(s) achievedGoal: Absence of Hospital-Acquired Illness or InjuryOutcome: Outcome(s) achievedGoal: Optimal Comfort and WellbeingOutcome: Outcome(s) achievedGoal: Readiness for Transition of CareOutcome: Outcome(s) achieved Problem: Fall Injury RiskGoal: Absence of Fall and Fall-Related InjuryOutcome: Outcome(s) achieved Problem: Wound Healing ProgressionGoal: Optimal Wound HealingOutcome: Outcome(s) achieved Problem: InfectionGoal: Absence of Infection Signs and SymptomsOutcome: Outcome(s) achieved Problem: DysrhythmiaGoal: Normalized Cardiac RhythmOutcome: Outcome(s) achieved Problem: VTE (Venous Thromboembolism)Goal: VTE (Venous Thromboembolism) Symptom ResolutionOutcome: Outcome(s) achieved

## 2023-09-30 NOTE — Plan of Care
 Plan of Care Overview/ Patient Status    No c/o on telle remains in afib no sob on room air vssProblem: Diabetes ComorbidityGoal: Blood Glucose Level Within Targeted RangeOutcome: Interventions implemented as appropriate Problem: Adult Inpatient Plan of CareGoal: Plan of Care ReviewOutcome: Interventions implemented as appropriateGoal: Patient-Specific Goal (Individualized)Outcome: Interventions implemented as appropriateGoal: Absence of Hospital-Acquired Illness or InjuryOutcome: Interventions implemented as appropriateGoal: Optimal Comfort and WellbeingOutcome: Interventions implemented as appropriateGoal: Readiness for Transition of CareOutcome: Interventions implemented as appropriate Problem: Fall Injury RiskGoal: Absence of Fall and Fall-Related InjuryOutcome: Interventions implemented as appropriate

## 2023-09-30 NOTE — Plan of Care
 Plan of Care Overview/ Patient Status    Problem: Adult Inpatient Plan of CareGoal: Readiness for Transition of CareOutcome: Outcome(s) achievedPatient discharged home this date to self care/MD follow up. Spouse to transport. Medicare IM up to date. No identified needs. Patient in agreement with discharge.Case Management Plan  Flowsheet Row Most Recent Value Discharge Planning  Patient/Patient Representative goals/treatment preferences for discharge are:  home, no services Patient/Patient Representative was presented with a list of facilities, agencies and/or dme providers and Referral(s) placed for: None Mode of Transportation  Private car  (add comment for special considerations) Patient accompanied by wife Designated Discharge Caregiver 1  Name designated caregiver Jaimy Habecker Phone number designated caregiver [phone] Relationship Patient Designated Caregiver Spouse CM D/C Readiness  PASRR completed and approved N/A Authorization number obtained, if required N/A Is there a 3 day INPATIENT Qualifying stay for Medicare Patients? N/A Medicare IM- signed, dated, timed and scanned, if required Yes DME Authorized/Delivered N/A No needs identified/ follow up with PCP/MD Yes Post acute care services secured W10 complete N/A Pri Completed and Accepted  N/A Is the destination address correct on the W10 N/A Finalized Plan  Expected Discharge Date 09/30/23 Discharge Disposition Home or Self Care  Ulla Gallo RN [facility]

## 2023-09-30 NOTE — Discharge Summary
 [facility] HospitalMed/Surg Discharge SummaryPatient Data:  Patient Name: Andrew [name] Admit date: 09/27/2023 Age: 67 y.o. Discharge date: 09/30/2023 DOB: 12-24-1955	 Discharge Attending Physician: Belinda Fisher,*  MRN: [id number]	 Discharged Condition: stable PCP: Lenetta Quaker, DO  Disposition: Home  Principal Diagnosis: Bilateral Pulmonary EmboliCoumadin FailureNew onset atrial fibrillationComorbidities Comorbidities present on admission:Class 1 obesity noted, with BMI (Calculated): 31.4.  Coagulopathic due to prescription of anticoagulant medication prior to admission.Secondary diagnoses occurring during hospitalization:HypocalcemiaThrombocytopenia  Post Discharge Follow Up Items: Issues to be Addressed Post Discharge:Medication changes on discharge (Full medication list at conclusion of this summary) :Current Discharge Medication List  Discontinued   amLODIPine (NORVASC) 5 mg tablet    LORazepam (ATIVAN) 0.5 mg tablet    warfarin (COUMADIN) 7.5 mg tablet    New  Details !! apixaban (ELIQUIS) 5 mg tablet Take 2 tablets (10 mg total) by mouth every 12 (twelve) hours for 6 days.Start date: 09/30/2023, End date: 10/06/2023  !! apixaban (ELIQUIS) 5 mg tablet Take 1 tablet (5 mg total) by mouth every 12 (twelve) hours.Start date: 10/06/2023   !! - Potential duplicate medications found. Please discuss with provider.  Pending Labs and Tests: Pending Lab Results   Order Current Status  Cardiolipin Abs, IgA, IgG, IgM     (BH GH LMW Q YH) In process  Extra gold top specimen In process  Blood Culture -1 set Preliminary result  Blood Culture -1 set Preliminary result  Blood Culture -1 set Preliminary result  Blood culture   (BH GH L LMW YH) Preliminary result  Blood culture   (BH GH L LMW YH) Preliminary result  Blood culture   (BH GH L LMW YH) Preliminary result Follow-up Information:Lanna, Bary Castilla, MD[address] [place] [phone] in 1 month(s)Vanasse, Randa Evens, MD[address] [place] [phone] in 2 week(s)Stuart, Currie Paris, DO[address] [place] [phone] an appointment as soon as possible for a visit in 1 week(s) No future appointments.Hospital Course: Myon Warford is a 67 y.o. 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  The patient noted that he feels off balance over the last 3 days.  He was having trouble doing his usual outdoor activities.  He noticed extreme fatigue when he was cutting firewood with his friend on Sunday. This was associated with unsteady gait, profuse sweating, and chills. He denies any coughing, dysuria, flank pain, abdominal pain, nausea, vomiting, diarrhea.  When asked about skin breaks or wounds, he stated that he had Chiggers for a few weeks but it has resolved. A recently healed venous stasis ulcer was also noted by patient. CTA was significant for bilateral small PEs.  Patient was admitted for further management.  Blood cultures were obtained and patient was placed on empiric IV antibiotics due to chills upon arrival.  He was placed on full-dose Lovenox and consultations were placed with Hematology as well as Cardiology.  Patient developed new onset atrial fibrillation during his stay with a controlled rate and remained hemodynamically stable.  On hospital day 2 he was transitioned to Eliquis loading dose after consultation with transition of care pharmacy determined adequate coverage.  His blood cultures showed no growth for 72 hours and his antibiotic was DC.  Echocardiogram was performed which showed ejection fraction of 66%, no valvular disease and no RV strain. He did not require supplemental oxygen during his stay.  On the day of discharge she is awake, alert ambulating in  the hall without dyspnea on room air and has tolerated his 1st 2 doses of Eliquis well.  He has no evidence of it anticoagulant related blood loss and hemoglobin is stable.  He has been instructed to follow-up with cardiology as directed, and Hematology within next 2 weeks stable be managing his outpatient anticoagulation.  He has also been instructed to follow-up with primary care in 1 week.  Prescriptions have been sent electronically to his pharmacy and he needs to stay in hospital until his wife has Eliquis in hand at home to continue loading dose.  He will be transitioned to routine dose of Eliquis on 10/06/2023 he is medically cleared for discharge..  Inpatient Consultants and summary of recommendations:  Hematology, CardiologyPertinent Procedures or Surgeries:  No proceduresData: Pertinent lab findings:Recent Labs Lab 11/07/240529 11/08/240533 11/09/240628 WBC 6.7 5.3 5.1 HGB 17.2* 15.7 16.4 HCT 52.80* 48.10 49.20 PLT 163 136* 136*  Recent Labs Lab 11/07/240529 11/08/240533 11/09/240628 NEUTROPHILS 51.5 57.5 58.3  Recent Labs Lab 11/07/240529 11/07/240707 11/08/240533 11/08/240757 11/09/240628 11/09/240753 11/09/241130 NA 139  --  139  --  137  --   --  K 4.3  --  4.1  --  3.7  --   --  CL 106  --  106  --  106  --   --  CO2 26  --  26  --  22  --   --  BUN 15  --  15  --  11  --   --  CREATININE 0.98  --  0.94  --  0.93  --   --  GLU 124*   < > 116*   < > 138*   < > 165* ANIONGAP 7  --  7  --  9  --   --   < > = values in this interval not displayed.  Recent Labs Lab 11/07/240529 11/08/240533 11/09/240628 CALCIUM 8.9 8.0* 8.6  Recent Labs Lab 11/07/240529 11/08/240533 11/09/240628 ALT 46 37 39 AST 26 25 25  ALKPHOS 48 41* 42* BILITOT 1.8* 1.2* 1.0  Recent Labs Lab 11/07/240529 11/08/240533 11/09/240628 PTT 27.0 30.1 25.4 LABPROT 14.9* 12.2* 11.1 INR 1.49* 1.21* 1.10  Microbiology:Recent Labs Lab 11/06/241505 LABBLOO No Growth to Date - No Growth to Date - No Growth to Date Imaging: Imaging results available in EpicDiet:  Diet Cardiac Consistent CarbohydrateMobility: Highest Level of mobility - ACTUAL: Mobility Level 7, Walk 25+ feet, AM PAC 22-23  Physical Exam Discharge vital signs: Vitals:  09/30/23 1430 BP: 104/68 Pulse: 85 Resp: 18 Temp: 98.2 ?F (36.8 ?C) Cognitive Status at Discharge: Baseline Alert and Oriented x 3Physical ExamVitals reviewed. Constitutional:     General: He is not in acute distress.HENT:    Head: Normocephalic.    Nose: No congestion.    Mouth/Throat:    Mouth: Mucous membranes are moist.    Pharynx: Oropharynx is clear. Eyes:    Conjunctiva/sclera: Conjunctivae normal. Cardiovascular:    Rate and Rhythm: Normal rate. Rhythm irregular. Pulmonary:    Effort: Pulmonary effort is normal. No respiratory distress.    Breath sounds: No wheezing, rhonchi or rales.    Comments: Clear upper lobes, diminished basesAbdominal:    General: Bowel sounds are normal. There is no distension.    Palpations: Abdomen is soft.    Tenderness: There is no abdominal tenderness. Musculoskeletal:    Cervical back: Neck supple.    Right lower leg: No edema.    Left lower leg: No edema. Skin:   General: Skin is warm and  dry. Neurological:    General: No focal deficit present.    Mental Status: He is alert and oriented to person, place, and time. Mental status is at baseline.    Cranial Nerves: No cranial nerve deficit. Psychiatric:       Mood and Affect: Mood normal.       Behavior: Behavior normal. History  Allergies Allergies Allergen Reactions  Sulfa (Sulfonamide Antibiotics) Hives  PMH PSH Past Medical History: Diagnosis Date  Diabetes mellitus (HC Code) DVT (deep venous thrombosis) (HC Code) (HC CODE) (HC Code)   Hypertension   No past surgical history on file. Social History Family History Social History Tobacco Use  Smoking status: Never  Smokeless tobacco: Not on file Substance Use Topics  Alcohol use: Not on file  History reviewed. No pertinent family history.   Discharge Medications  Discharge: Current Discharge Medication List  START taking these medications  Details apixaban (ELIQUIS) 5 mg tablet Take 2 tablets (10 mg total) by mouth every 12 (twelve) hours for 6 days, THEN 1 tablet (5 mg total) every 12 (twelve) hours.Qty: 72 tablet, Refills: 0Start date: 09/30/2023, End date: 10/30/2023   CONTINUE these medications which have NOT CHANGED  Details JANUVIA 100 mg tablet Take 1 tablet (100 mg total) by mouth daily.  JARDIANCE 10 mg tablet Take 1 tablet (10 mg total) by mouth daily.  lisinopriL (PRINIVIL,ZESTRIL) 10 mg tablet Take 1 tablet (10 mg total) by mouth daily.  BLOOD GLUCOSE METER device Use to check blood sugar as needed. Diabetes mellitus type 2 [E11.9]. Please dispense glucometer covered by patient's insurance.Qty: 1 each, Refills: 0  blood sugar diagnostic test strips Use to check blood sugar as needed. Diabetes mellitus type 2 [E11.9]. Please dispense supplies covered by patient's insurance.Qty: 10 each, Refills: 3  lancets Use to check blood sugar as needed. Diabetes mellitus type 2 [E11.9]. Please dispense supplies covered by patient's insurance.Qty: 50 each, Refills: 0  tadalafiL (CIALIS) 20 mg tablet Take 1 tablet (20 mg total) by mouth daily as needed.   STOP taking these medications   amLODIPine (NORVASC) 5 mg tablet    LORazepam (ATIVAN) 0.5 mg tablet    warfarin (COUMADIN) 7.5 mg tablet      35 minutes spent on the discharge of this patientElectronically Signed:Catia Todorov Leotis Shames, APRN 09/30/2023 2:32 PM

## 2023-10-01 ENCOUNTER — Encounter: Admit: 2023-10-01 | Payer: PRIVATE HEALTH INSURANCE | Primary: Internal Medicine

## 2023-10-02 LAB — CARDIOLIPIN ABS, IGA, IGG, IGM     (BH GH LMW Q YH)
BKR ACA IGA: 3.8 U/mL (ref <14.0)
BKR ACA IGG: <2 GPL U/mL (ref <10.0)
BKR ACA IGM: 6 [MPL'U]/mL (ref <10.0)

## 2023-10-03 ENCOUNTER — Ambulatory Visit: Admit: 2023-10-03 | Payer: PRIVATE HEALTH INSURANCE | Primary: Internal Medicine

## 2023-10-03 DIAGNOSIS — I82461 Acute embolism and thrombosis of right calf muscular vein: Secondary | ICD-10-CM

## 2023-10-03 LAB — BLOOD CULTURE   (BH GH L LMW YH)
BKR BLOOD CULTURE: NO GROWTH
BKR BLOOD CULTURE: NO GROWTH
BKR BLOOD CULTURE: NO GROWTH

## 2023-10-03 NOTE — Progress Notes
 [facility] Post Discharge Outreach: Transition of Care NoteRisk of Unplanned Readmission: 11.28%PERTINENT INFORMATION:   -  Full assessment completed, no concerns identified [facility] Post Discharge Outreach spoke with: PatientDischarging Hospital[facility] [place] Discharge Date: 09/30/2023    Discharge location: HomeHOSPITALIZATION:Pulmonary embolism, unspecified chronicity, unspecified pulmonary embolism type, unspecified whether acute cor pulmonale presentCURRENT STATE:Since discharge patient reports feeling: BetterFeels good getting better each day. Started his new medications.Patient cared for by: Spouse  REVIEW OF AFTER VISIT SUMMARY DOCUMENT: MEDICATION CHANGES:Validated NEW medications to take: YesValidated Changed medications to take: N/AValidated Stopped medications to NOT take: YesIssues obtaining prescriptions: No FOLLOW-UP APPOINTMENTS and TRANSPORTATION:Patient aware of scheduled appointments: N/AAwareness and assistance with appointments needing to be scheduled: NoTransportation concerns for follow-up appointment: NoDME and HOME HEALTH SERVICES:Durable medical equipment received: N/AContact has been made with home care agency: N/APlan established for follow up labs/tests: N/A ADDITIONAL PATIENT NEEDS:

## 2023-10-03 NOTE — Progress Notes
 Pharmacist Anticoagulation Clinic Discharge NoteLeonard [name] is being discharged from the anticoagulation clinic due to alternative therapy initiated.The following has been completed upon discharge:Discharge verified/communicated with the patient and referring provider: Confirmed with provider (note or call) and discharge letter sentMedication list updatedAll active warfarin prescriptions cancelledAll standing INR lab/home draw orders cancelled Other, if applicable: future appointments cancelledThe patient is being discharged from the Anticoagulation Clinic at this time. If the patient needs to be re-started on warfarin, he can be re-enrolled within the Anticoagulation Clinic and we would be happy to accommodate a new referral in the future if needed.Electronically Signed by Delbert Harness, PharmD, October 03, 2023

## 2023-12-08 ENCOUNTER — Ambulatory Visit: Admit: 2023-12-08 | Payer: BLUE CROSS/BLUE SHIELD | Attending: Hematology & Oncology | Primary: Internal Medicine

## 2023-12-08 ENCOUNTER — Inpatient Hospital Stay: Admit: 2023-12-08 | Discharge: 2023-12-08 | Payer: BLUE CROSS/BLUE SHIELD | Primary: Internal Medicine

## 2023-12-08 VITALS — BP 121/62 | HR 84 | Temp 98.40000°F | Resp 20 | Ht 73.0 in | Wt 246.5 lb

## 2023-12-08 DIAGNOSIS — R5383 Other fatigue: Principal | ICD-10-CM

## 2023-12-08 DIAGNOSIS — I2699 Other pulmonary embolism without acute cor pulmonale: Secondary | ICD-10-CM

## 2023-12-08 LAB — CBC WITH AUTO DIFFERENTIAL
BKR WAM ABSOLUTE IMMATURE GRANULOCYTES.: 0.01 x 1000/ÂµL (ref 0.00–0.30)
BKR WAM ABSOLUTE LYMPHOCYTE COUNT.: 1.24 x 1000/ÂµL (ref 0.60–3.70)
BKR WAM ABSOLUTE NRBC (2 DEC): 0 x 1000/ÂµL (ref 0.00–1.00)
BKR WAM ANC (ABSOLUTE NEUTROPHIL COUNT): 2.89 x 1000/ÂµL (ref 2.00–7.60)
BKR WAM BASOPHIL ABSOLUTE COUNT.: 0.05 x 1000/ÂµL (ref 0.00–1.00)
BKR WAM BASOPHILS: 1 % (ref 0.0–1.4)
BKR WAM EOSINOPHIL ABSOLUTE COUNT.: 0.06 x 1000/ÂµL (ref 0.00–1.00)
BKR WAM EOSINOPHILS: 1.2 % (ref 0.0–5.0)
BKR WAM HEMATOCRIT (2 DEC): 45.5 % (ref 38.50–50.00)
BKR WAM HEMOGLOBIN: 15.5 g/dL (ref 13.2–17.1)
BKR WAM IMMATURE GRANULOCYTES: 0.2 % (ref 0.0–1.0)
BKR WAM LYMPHOCYTES: 24.2 % (ref 17.0–50.0)
BKR WAM MCH (PG): 31 pg (ref 27.0–33.0)
BKR WAM MCHC: 34.1 g/dL (ref 31.0–36.0)
BKR WAM MCV: 91 fL (ref 80.0–100.0)
BKR WAM MONOCYTE ABSOLUTE COUNT.: 0.87 x 1000/ÂµL (ref 0.00–1.00)
BKR WAM MONOCYTES: 17 % — ABNORMAL HIGH (ref 4.0–12.0)
BKR WAM MPV: 9.5 fL (ref 8.0–12.0)
BKR WAM NEUTROPHILS: 56.4 % (ref 39.0–72.0)
BKR WAM NUCLEATED RED BLOOD CELLS: 0 % (ref 0.0–1.0)
BKR WAM PLATELETS: 141 x1000/ÂµL — ABNORMAL LOW (ref 150–420)
BKR WAM RDW-CV: 13.8 % (ref 11.0–15.0)
BKR WAM RED BLOOD CELL COUNT.: 5 M/ÂµL (ref 4.00–6.00)
BKR WAM WHITE BLOOD CELL COUNT: 5.1 x1000/ÂµL (ref 4.0–11.0)

## 2023-12-08 LAB — COMPREHENSIVE METABOLIC PANEL
BKR ALANINE AMINOTRANSFERASE (ALT): 36 U/L (ref 12–78)
BKR ALBUMIN: 4 g/dL (ref 3.4–5.0)
BKR ALKALINE PHOSPHATASE: 41 U/L — ABNORMAL LOW (ref 45–117)
BKR ANION GAP (LM): 7 mmol/L (ref 5–15)
BKR ASPARTATE AMINOTRANSFERASE (AST): 22 U/L (ref 15–37)
BKR BILIRUBIN TOTAL: 0.8 mg/dL (ref 0.2–1.0)
BKR BLOOD UREA NITROGEN: 19 mg/dL — ABNORMAL HIGH (ref 7–18)
BKR CALCIUM: 9.3 mg/dL (ref 8.5–10.1)
BKR CHLORIDE: 105 mmol/L (ref 98–107)
BKR CO2: 25 mmol/L (ref 21–32)
BKR CREATININE DELTA: 0.01
BKR CREATININE: 0.94 mg/dL (ref 0.70–1.30)
BKR EGFR, CREATININE (CKD-EPI 2021): >60 mL/min/{1.73_m2} (ref >=60)
BKR GLOBULIN: 3.6 g/dL (ref 2.5–5.0)
BKR GLUCOSE: 114 mg/dL — ABNORMAL HIGH (ref 65–110)
BKR POTASSIUM: 4.4 mmol/L (ref 3.5–5.1)
BKR PROTEIN TOTAL: 7.6 g/dL (ref 6.4–8.2)
BKR SODIUM: 137 mmol/L (ref 136–145)

## 2023-12-08 LAB — IRON AND TIBC
BKR IRON SATURATION (SCCCT BH GH LM): 56 % — ABNORMAL HIGH (ref 27–40)
BKR IRON: 211 ug/dL — ABNORMAL HIGH (ref 65–175)
BKR TOTAL IRON BINDING CAPACITY (SCCCT  BH GH LM): 377 ug/dL (ref 250–450)

## 2023-12-08 LAB — VITAMIN B12: BKR VITAMIN B12: 287 pg/mL (ref 211–911)

## 2023-12-08 LAB — FERRITIN: BKR FERRITIN: 48 ng/mL (ref 22–322)

## 2023-12-08 MED ORDER — APIXABAN 5 MG TABLET
5 | ORAL_TABLET | Freq: Two times a day (BID) | ORAL | 4 refills | Status: AC
Start: 2023-12-08 — End: ?

## 2023-12-08 MED ORDER — APIXABAN 5 MG TABLET
5 | Freq: Two times a day (BID) | ORAL | Status: AC
Start: 2023-12-08 — End: 2023-12-08

## 2023-12-10 NOTE — Progress Notes
 Patient: Andrew [name] DOB:  [birth year]MRN: [id number] Visit Date: 12/08/2023 Follow Up NoteIdentifier:  Andrew [name] is a 68 y.o. gentleman here in follow-up for pulmonary embolism. Interval History: The patient was seen by me during his hospitalization on September 28, 2023, for pulmonary embolism. The patient, with a recent history of PE, reports experiencing periodic episodes of fatigue and malaise approximately once every week to ten days. These episodes are characterized by a desire to rest and read in bed, with no significant shortness of breath. The patient notes that these episodes typically resolve within a day and do not seem to be precipitated by any identifiable factors.The patient's daily routine includes activities such as walking around town, cutting firewood, and doing house chores. However, during the episodes of fatigue, the patient reports a significant decrease in energy levels, making it difficult to continue with these activities.The patient denies experiencing any chest pain, shortness of breath, nausea, vomiting, or abdominal pain. Bowel movements are reported to be normal with no blood or changes in color. The patient's appetite remains good.The patient has been taking Eliquis consistently and reports occasional night sweats and chills since being discharged from the hospital approximately nine weeks ago. The patient has a family history of DVT, with an aunt who had the condition.The patient is a nonsmoker and has been on blood thinners for a long time, including Coumadin. The patient is aware of the precautions associated with being on a blood thinner.Home Medications:  All medications were reviewed with the patient on this date.  Allergies/Adverse Reactions: Allergies Allergen Reactions  Sulfa (Sulfonamide Antibiotics) Hives REVIEW OF PAST MEDICAL,SURGICAL,SOCIAL,FAMILY HISTORY: No change. Physical Examination: Vitals:Vitals:  12/08/23 1114 BP: 121/62 Pulse: 84 Resp: 20 Temp: 98.4 ?F (36.9 ?C) Weight: 246 lb 8 oz  Physical ExamConstitutional:     General: He is not in acute distress.   Appearance: He is well-developed. HENT:    Head: Normocephalic and atraumatic. Eyes:    General: No scleral icterus.Cardiovascular:    Heart sounds: No murmur heard.Pulmonary:    Effort: Pulmonary effort is normal.    Breath sounds: Normal breath sounds. Abdominal:    General: Bowel sounds are normal. There is no distension.    Palpations: Abdomen is soft.    Tenderness: There is no abdominal tenderness. Musculoskeletal:    Cervical back: Neck supple.    Right lower leg: No edema.    Left lower leg: No edema. Lymphadenopathy:    Head:    Right side of head: No submental, submandibular, tonsillar, posterior auricular or occipital adenopathy.    Left side of head: No submental, submandibular, tonsillar, posterior auricular or occipital adenopathy.    Cervical: No cervical adenopathy.    Upper Body:    Right upper body: No supraclavicular adenopathy.    Left upper body: No supraclavicular adenopathy. Skin:   General: Skin is warm and dry. Neurological:    Mental Status: He is alert. Laboratory Data: Recent Results (from the past 36 weeks) CBC auto differential  Collection Time: 09/30/23  6:28 AM Result Value Ref Range  WBC 5.1 4.0 - 11.0 x1000/?L  RBC 5.36 4.00 - 6.00 M/?L  Hemoglobin 16.4 13.2 - 17.1 g/dL  Hematocrit 40.98 11.91 - 50.00 %  MCV 91.8 80.0 - 100.0 fL  MCH 30.6 27.0 - 33.0 pg  MCHC 33.3 31.0 - 36.0 g/dL  RDW-CV 47.8 29.5 - 62.1 %  Platelets 136 (L) 150 - 420 x1000/?L  MPV 10.2 8.0 - 12.0 fL  Neutrophils 58.3 39.0 - 72.0 %  Lymphocytes 25.8 17.0 - 50.0 %  Monocytes 12.9 (H) 4.0 - 12.0 %  Eosinophils 1.8 0.0 - 5.0 %  Basophil 0.8 0.0 - 1.4 %  Immature Granulocytes 0.4 0.0 - 1.0 %  nRBC 0.0 0.0 - 1.0 % Absolute Lymphocyte Count 1.32 0.60 - 3.70 x 1000/?L  Monocyte Absolute Count 0.66 0.00 - 1.00 x 1000/?L  Eosinophil Absolute Count 0.09 0.00 - 1.00 x 1000/?L  Basophil Absolute Count 0.04 0.00 - 1.00 x 1000/?L  Absolute Immature Granulocyte Count 0.02 0.00 - 0.30 x 1000/?L  Absolute nRBC 0.00 0.00 - 1.00 x 1000/?L  ANC (Abs Neutrophil Count) 2.98 2.00 - 7.60 x 1000/?L  ComponentRef Range & Units 09/28/2023 Erythropoietin (EPO), S2.6 - 18.5 mIU/mL 9.5 Imaging: September 27, 2023 - XR Chest PA or AP-use if patient is unstableFINDINGS:The lungs are clear. The heart and mediastinum are within normal limits. Osseous structures appear intact. IMPRESSION: No acute process is seen.September 27, 2023 - [place] head FINDINGS: There is generalized atrophy. No intracranial hemorrhage is seen. No extra-axial fluid collection is seen.  There is nonspecific white matter disease. Bilateral basal drain catheter calcifications are present.  The ventricles are normal in size and are midline. The visualized paranasal sinuses are clear.    The mastoid air cells are clear.  No gross intraorbital abnormality is seen.    IMPRESSION:  No acute intracranial abnormality is seen.September 27, 2023 - CTA chest (PE)FINDINGS: The main pulmonary arteries are clear. There are small filling defects seen in the right descending pulmonary artery, consistent with small volume of pulmonary emboli. The ascending aorta, aortic arch, and descending thoracic aorta are normal in size. There is cardiac enlargement and there are coronary artery calcification. No pericardial effusion is seen. The lungs are clear of infiltrates. No pulmonary nodule or pleural effusion is seen. There is no pathologic axillary, mediastinal, or hilar adenopathy. The upper abdomen demonstrates a small hiatal hernia. Osseous structures are intact. IMPRESSION:There is a small volume of pulmonary emboli. Otherwise, no acute pulmonary process is seen.September 27, 2023 - XR Abdomen AP And Decub or Erect ViewFINDINGS: A small portion of the superior aspect liver and right hemidiaphragm are excluded.  There is a paucity of small bowel gas with no abnormal small bowel dilatation. Stool is seen throughout the majority the colon. No free subdiaphragmatic gas noted.  No air-fluid levels noted.   No acute osseous fracture noted.  No radiopaque foreign body is noted. IMPRESSION: Findings appear most consistent with constipation. No discernible free subdiaphragmatic gas.September 28, 2023 - [place] abdomen and pelvis FINDINGS: The lung bases are clear. The heart is upper normal in size. Abdomen: No hydronephrosis is seen in either kidney. There is no enhancing renal mass. There are no renal calculi. There is bilateral trace, nonspecific perinephric fluid. There is mild diffuse hepatic pseudocyst. The Gallbladder, spleen, pancreas, and adrenal glands are normal in appearance. The abdominal aorta is normal in size and demonstrates scattered atherosclerotic calcifications. There are scattered tiny, subcentimeter retroperitoneal lymph nodes and scattered mesenteric lymph nodes, consistent with benign reactive disease. The stomach is unremarkable. The small bowel is normal in caliber. The region of the terminal ileum is normal. The appendix is normal in size and demonstrates an appendicolith. The colon is normal in caliber. Pelvis: The bladder is smoothly contoured. The prostate gland is normal size. There is no free fluid. There is a small fat-containing left inguinal hernia. Musculoskeletal: No lytic or blastic lesion is seen. IMPRESSION: 1.  No acute abdominal process is  seen. 2.  Incidental findings include trace, nonspecific bilateral perinephric fluid, hepatic steatosis, and small fat-containing left inguinal hernia.Assessment/Plan: Andrew [name] is a 68 y.o. 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 Patient was found to have bilateral pulmonary emboli on a [place] chest from September 27, 2023. This occurs in the context of a subtherapeutic INR on October 31st 2024. Patient has been transitioned to Eliquis. There is a family history of DVT in his paternal aunt. 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 Patient is now on Eliquis 5 mg twice a day. Patient will be on lifelong anticoagulation unless there is a contraindication. We will pursue a hereditary hypercoagulable workup. We did review today that some of the tests will require prior authorization, and there is a possibility that some of the tests may not be authorized. New-onset AFib diagnosed in November of 2024. Patient reports he should be following up with Dr. Viviana Simpler from Cardiology in the next several weeks. Patient's hemoglobin had been at the upper end of normal while hospitalized. Erythropoietin  level has been within normal limits. The patient will return to see me in approximately 6 months' time, or sooner if needed. We will check a CBC with differential and CMP prior to the visit. Patient reports cyclical episodes of fatigue occurring every several days. We will check ferritin, iron panel, and B12 today. I have encouraged him to review these symptoms with his PCP and cardiologist as well. Complexity of medical decision making: MODERATE Patient will continue to follow with me for ongoing/longitudinal care related to medical condition(s) : Pulmonary embolism on anticoagulationScribed for Sol Passer, MD by Levert Feinstein, medical scribe January 17, 2025The documentation recorded by the scribe accurately reflects the services I personally performed and the decisions made by me. I reviewed and confirmed all material entered and/or pre-charted by the scribe. Colbert Ewing, MDAssistant Professor of Clinical Medicine (Medical Oncology and Hematology)[facility] at [place], [place], [place] [phone]

## 2023-12-11 LAB — PROTEIN C ANTIGEN, TOTAL     (BH GH LMW Q YH): PROTEIN C AG, P: 77 % (ref 72–160)

## 2023-12-12 LAB — ANTITHROMBIN ACTIVITY: BKR ANTITHROMBIN ACTIVITY: 82 % — ABNORMAL LOW (ref 85–133)

## 2023-12-12 LAB — PARTIAL THROMBOPLASTIN TIME     (BH GH LMW Q YH): BKR PARTIAL THROMBOPLASTIN TIME: 27.8 s (ref 23.0–31.0)

## 2023-12-12 LAB — PROTIME AND INR
BKR INR: 1.02 (ref 0.86–1.13)
BKR PROTHROMBIN TIME: 11.2 s (ref 9.6–12.3)

## 2023-12-13 LAB — SPECIAL COAGULATION MD INTERPRETATION (LAB ORDERABLE ONLY) (GH YH)

## 2023-12-13 LAB — PROTEIN S ACTIVITY: BKR PROTEIN S ACTIVITY: 123 % (ref 62–150)

## 2023-12-13 LAB — FREE PROTEIN S ANTIGEN   (BH LMW Q YH): BKR PROTEIN S ANTIGEN FREE: 121 % (ref 47–121)

## 2023-12-13 LAB — PROTEIN C ACTIVITY: BKR PROTEIN C ACTIVITY: 102 % (ref 81–145)

## 2023-12-13 LAB — ACTIVATED PROTEIN C RESISTANCE: BKR APCR-V RATIO: 2.99 (ref >=2.27)

## 2023-12-15 NOTE — Other
 [place] low ATIII, significance uncertain.  Is unknown whether or not the factor 5 Leiden, prothrombin gene mutation or PA-1 mutation will be authorized.

## 2023-12-19 ENCOUNTER — Telehealth: Admit: 2023-12-19 | Payer: PRIVATE HEALTH INSURANCE | Attending: Hematology & Oncology | Primary: Internal Medicine

## 2023-12-19 NOTE — Telephone Encounter
 Left message for this patient to call back the office.  Need to speak with him in regards to labs that were waiting insurance approval.  One got approved, the other two did not.  If he could come in a his earliest convenience to have the one lab drawn.  He can go to the lab downstairs without an appoinment.

## 2023-12-19 NOTE — Telephone Encounter
 Patient returned phone call.  Patient will come in either this afternoon or tomorrow afternoon for the one test that did get approved.  Patient stated that if Dr. Rico Junker wants to push to get the two that got denied he would be ok with that.

## 2024-01-02 ENCOUNTER — Inpatient Hospital Stay: Admit: 2024-01-02 | Discharge: 2024-01-02 | Payer: MEDICARE | Primary: Internal Medicine

## 2024-01-02 ENCOUNTER — Encounter: Admit: 2024-01-02 | Payer: PRIVATE HEALTH INSURANCE | Attending: Internal Medicine | Primary: Internal Medicine

## 2024-01-02 ENCOUNTER — Encounter: Admit: 2024-01-02 | Payer: PRIVATE HEALTH INSURANCE | Attending: Hematology & Oncology | Primary: Internal Medicine

## 2024-01-02 DIAGNOSIS — I2699 Other pulmonary embolism without acute cor pulmonale: Secondary | ICD-10-CM

## 2024-01-02 DIAGNOSIS — T81718A Complication of other artery following a procedure, not elsewhere classified, initial encounter: Secondary | ICD-10-CM

## 2024-01-02 DIAGNOSIS — I82461 Acute embolism and thrombosis of right calf muscular vein: Secondary | ICD-10-CM

## 2024-01-11 LAB — MAYO ROOM TEMPERATURE GENERIC ORDERABLE     (BH GH LMW YH)

## 2024-01-19 ENCOUNTER — Telehealth: Admit: 2024-01-19 | Payer: PRIVATE HEALTH INSURANCE | Attending: Hematology & Oncology | Primary: Internal Medicine

## 2024-01-19 ENCOUNTER — Encounter: Admit: 2024-01-19 | Payer: PRIVATE HEALTH INSURANCE | Primary: Internal Medicine

## 2024-01-19 NOTE — Telephone Encounter
 Per Dr. Rico Junker returned call to pt, reviewed her result note, questions answered, verbalized understanding. Pt requested copy of message be sent to him, RN did so through MyChart, called again about later and confirmed pt rec'd, thanks RN for calls.

## 2024-01-19 NOTE — Telephone Encounter
 Per Dr. Rico Junker called pt with results of testing, that results indicate may be cause of clotting event, verbalized understanding, request

## 2024-01-19 NOTE — Other
 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 The 4G/5G genotype is associated with the intermediate PAI-1 activity and antigen levels.  This mutation may potentially confer an increased risk for coronary artery disease, venous thromboembolic disease and possibly complications of pregnancy.  This is inherited.  This may explain why Andrew [name] had his clotting event.   I did call and leave a message for the patient indicating that I called to review his recent lab test.  I did leave a message stating that I would put some comments on MyChart

## 2024-05-16 ENCOUNTER — Ambulatory Visit: Admit: 2024-05-16 | Payer: BLUE CROSS/BLUE SHIELD | Attending: Medical | Primary: Internal Medicine

## 2024-06-14 ENCOUNTER — Ambulatory Visit: Admit: 2024-06-14 | Payer: PRIVATE HEALTH INSURANCE | Attending: Hematology & Oncology | Primary: Internal Medicine

## 2024-06-18 ENCOUNTER — Ambulatory Visit: Admit: 2024-06-18 | Payer: PRIVATE HEALTH INSURANCE | Attending: Medical | Primary: Internal Medicine

## 2024-06-18 ENCOUNTER — Ambulatory Visit: Admit: 2024-06-18 | Payer: BLUE CROSS/BLUE SHIELD | Attending: Medical | Primary: Internal Medicine

## 2024-07-08 ENCOUNTER — Other Ambulatory Visit: Admit: 2024-07-08 | Payer: BLUE CROSS/BLUE SHIELD | Attending: Gastroenterology | Primary: Internal Medicine

## 2024-07-08 DIAGNOSIS — Z1211 Encounter for screening for malignant neoplasm of colon: Principal | ICD-10-CM

## 2024-07-09 ENCOUNTER — Encounter: Admit: 2024-07-09 | Payer: PRIVATE HEALTH INSURANCE | Attending: Medical | Primary: Internal Medicine

## 2024-07-09 ENCOUNTER — Inpatient Hospital Stay: Admit: 2024-07-09 | Discharge: 2024-07-09 | Payer: BLUE CROSS/BLUE SHIELD | Primary: Internal Medicine

## 2024-07-09 DIAGNOSIS — I2699 Other pulmonary embolism without acute cor pulmonale: Principal | ICD-10-CM

## 2024-07-09 LAB — CBC WITH AUTO DIFFERENTIAL
BKR WAM ABSOLUTE IMMATURE GRANULOCYTES.: 0.02 x 1000/ÂµL (ref 0.00–0.30)
BKR WAM ABSOLUTE LYMPHOCYTE COUNT.: 1.28 x 1000/ÂµL (ref 0.60–3.70)
BKR WAM ABSOLUTE NRBC: 0 x 1000/ÂµL (ref 0.00–1.00)
BKR WAM ANC (ABSOLUTE NEUTROPHIL COUNT): 1.8 x 1000/ÂµL — ABNORMAL LOW (ref 2.00–7.60)
BKR WAM BASOPHIL ABSOLUTE COUNT.: 0.05 x 1000/ÂµL (ref 0.00–1.00)
BKR WAM BASOPHILS: 1.2 % (ref 0.0–1.4)
BKR WAM EOSINOPHIL ABSOLUTE COUNT.: 0.07 x 1000/ÂµL (ref 0.00–1.00)
BKR WAM EOSINOPHILS: 1.7 % (ref 0.0–5.0)
BKR WAM HEMATOCRIT: 46.1 % (ref 38.50–50.00)
BKR WAM HEMOGLOBIN: 15.1 g/dL (ref 13.2–17.1)
BKR WAM IMMATURE GRANULOCYTES: 0.5 % (ref 0.0–1.0)
BKR WAM LYMPHOCYTES: 31.5 % (ref 17.0–50.0)
BKR WAM MCH: 30.8 pg (ref 27.0–33.0)
BKR WAM MCHC: 32.8 g/dL (ref 31.0–36.0)
BKR WAM MCV: 94.1 fL (ref 80.0–100.0)
BKR WAM MONOCYTE ABSOLUTE COUNT.: 0.84 x 1000/ÂµL (ref 0.00–1.00)
BKR WAM MONOCYTES: 20.7 % — ABNORMAL HIGH (ref 4.0–12.0)
BKR WAM MPV: 10.3 fL (ref 8.0–12.0)
BKR WAM NEUTROPHILS: 44.4 % (ref 39.0–72.0)
BKR WAM NUCLEATED RED BLOOD CELLS: 0 % (ref 0.0–1.0)
BKR WAM PLATELETS: 149 x1000/ÂµL — ABNORMAL LOW (ref 150–420)
BKR WAM RDW-CV: 14.2 % (ref 11.0–15.0)
BKR WAM RED BLOOD CELL COUNT.: 4.9 M/ÂµL (ref 4.00–6.00)
BKR WAM WHITE BLOOD CELL COUNT: 4.1 x1000/ÂµL (ref 4.0–11.0)

## 2024-07-09 LAB — COMPREHENSIVE METABOLIC PANEL
BKR ALANINE AMINOTRANSFERASE (ALT): 60 U/L (ref 12–78)
BKR ALBUMIN: 4 g/dL (ref 3.4–5.0)
BKR ALKALINE PHOSPHATASE: 48 U/L (ref 45–117)
BKR ANION GAP (LM): 7 mmol/L (ref 5–15)
BKR ASPARTATE AMINOTRANSFERASE (AST): 28 U/L (ref 15–37)
BKR BILIRUBIN TOTAL: 0.6 mg/dL (ref 0.2–1.0)
BKR BLOOD UREA NITROGEN: 16 mg/dL (ref 7–18)
BKR CALCIUM: 9.1 mg/dL (ref 8.5–10.1)
BKR CHLORIDE: 107 mmol/L (ref 98–107)
BKR CO2: 25 mmol/L (ref 21–32)
BKR CREATININE DELTA: -0.05
BKR CREATININE: 0.89 mg/dL (ref 0.70–1.30)
BKR EGFR, CREATININE (CKD-EPI 2021): >60 mL/min/1.73m2 (ref >=60)
BKR GLOBULIN: 3.6 g/dL (ref 2.5–5.0)
BKR GLUCOSE: 161 mg/dL — ABNORMAL HIGH (ref 65–110)
BKR POTASSIUM: 4.3 mmol/L (ref 3.5–5.1)
BKR PROTEIN TOTAL: 7.6 g/dL (ref 6.4–8.2)
BKR SODIUM: 139 mmol/L (ref 136–145)

## 2024-07-17 ENCOUNTER — Ambulatory Visit: Admit: 2024-07-17 | Payer: PRIVATE HEALTH INSURANCE | Attending: Medical | Primary: Internal Medicine

## 2024-07-17 DIAGNOSIS — R0602 Shortness of breath: Secondary | ICD-10-CM

## 2024-07-17 DIAGNOSIS — I2699 Other pulmonary embolism without acute cor pulmonale: Principal | ICD-10-CM

## 2024-07-23 NOTE — Progress Notes
 Patient: Andrew [name] DOB:  1957/09/02MRN: [id number] Visit Date: 07/17/2024 Follow Up NoteDIAGNOSES:PE4G/5G genotype of the PAI-1 geneCURRENT THERAPY: Eliquis  5mg  BIDINTERIM HISTORY: Since last seen, he notes feeling fairly well over the winter and early spring however this summer he notes intermittent shortness of breath even when doing small tasks such as sweeping the floor.  This is impacting his daily life.  He did see Dr. Elsworth and did an EKG which was normal however he notes he is going to be wearing a heart monitor within the next few weeks.  He does not have a pulmonologist.Denies fevers, chills, sweats, nausea, vomiting, diarrhea, constipation, abdominal pain, cough, SOB, rashes, or unusual bleeding/bruising. PAST MEDICAL HISTORY: Past Medical History: Diagnosis Date  Diabetes mellitus (HC Code)   DVT (deep venous thrombosis) (HC Code)   Hypertension  ALLERGIES: Allergies Allergen Reactions  Sulfa (Sulfonamide Antibiotics) Hives CURRENT MEDICATIONS:  Current Outpatient Medications on File Prior to Visit Medication Sig Dispense Refill  apixaban  (ELIQUIS ) 5 mg tablet Take 1 tablet (5 mg total) by mouth 2 (two) times daily. 180 tablet 3  BLOOD GLUCOSE METER device Use to check blood sugar as needed. Diabetes mellitus type 2 [E11.9]. Please dispense glucometer covered by patient's insurance. 1 each 0  blood sugar diagnostic test strips Use to check blood sugar as needed. Diabetes mellitus type 2 [E11.9]. Please dispense supplies covered by patient's insurance. 10 each 3  JANUVIA  100 mg tablet Take 1 tablet (100 mg total) by mouth daily.    JARDIANCE  10 mg tablet Take 1 tablet (10 mg total) by mouth daily.    lancets Use to check blood sugar as needed. Diabetes mellitus type 2 [E11.9]. Please dispense supplies covered by patient's insurance. 50 each 0  lisinopriL  (PRINIVIL ,ZESTRIL ) 10 mg tablet Take 1 tablet (10 mg total) by mouth daily.    tadalafiL  (CIALIS ) 20 mg tablet Take 1 tablet (20 mg total) by mouth daily as needed.   No current facility-administered medications on file prior to visit.  FAMILY HISTORY: Cancer-related family history is not on file. REVIEW OF SYSTEMS:  12 point review of systems is negative aside from that mentioned above.    PHYSICAL EXAM:     There were no vitals filed for this visit.Telehealth visit - no examLABORATORY DATA: Results for orders placed or performed during the hospital encounter of 07/09/24 CBC auto differential  Collection Time: 07/09/24  9:26 AM Result Value Ref Range  WBC 4.1 4.0 - 11.0 x1000/?L  RBC 4.90 4.00 - 6.00 M/?L  Hemoglobin 15.1 13.2 - 17.1 g/dL  Hematocrit 53.89 61.49 - 50.00 %  MCV 94.1 80.0 - 100.0 fL  MCH 30.8 27.0 - 33.0 pg  MCHC 32.8 31.0 - 36.0 g/dL  RDW-CV 85.7 88.9 - 84.9 %  Platelets 149 (L) 150 - 420 x1000/?L  MPV 10.3 8.0 - 12.0 fL  Neutrophils 44.4 39.0 - 72.0 %  Lymphocytes 31.5 17.0 - 50.0 %  Monocytes 20.7 (H) 4.0 - 12.0 %  Eosinophils 1.7 0.0 - 5.0 %  Basophil 1.2 0.0 - 1.4 %  Immature Granulocytes 0.5 0.0 - 1.0 %  nRBC 0.0 0.0 - 1.0 %  Absolute Lymphocyte Count 1.28 0.60 - 3.70 x 1000/?L  Monocyte Absolute Count 0.84 0.00 - 1.00 x 1000/?L  Eosinophil Absolute Count 0.07 0.00 - 1.00 x 1000/?L  Basophil Absolute Count 0.05 0.00 - 1.00 x 1000/?L  Absolute Immature Granulocyte Count 0.02 0.00 - 0.30 x 1000/?L  Absolute nRBC 0.00 0.00 - 1.00 x 1000/?L  ANC (Abs Neutrophil  Count) 1.80 (L) 2.00 - 7.60 x 1000/?L Comprehensive metabolic panel  Collection Time: 07/09/24  9:26 AM Result Value Ref Range  Sodium 139 136 - 145 mmol/L  Potassium 4.3 3.5 - 5.1 mmol/L  Chloride 107 98 - 107 mmol/L  CO2 25 21 - 32 mmol/L  Anion Gap 7 5 - 15 mmol/L  Glucose 161 (H) 65 - 110 mg/dL  BUN 16 7 - 18 mg/dL  Creatinine 9.10 9.29 - 1.30 mg/dL  Calcium 9.1 8.5 - 89.8 mg/dL  Total Protein 7.6 6.4 - 8.2 g/dL  Albumin 4.0 3.4 - 5.0 g/dL  Globulin 3.6 2.5 - 5.0 g/dL  Total Bilirubin 0.6 0.2 - 1.0 mg/dL  Alkaline Phosphatase 48 45 - 117 U/L  Alanine Aminotransferase (ALT) 60 12 - 78 U/L  Aspartate Aminotransferase (AST) 28 15 - 37 U/L  eGFR (Creatinine) >60 >=60 mL/min/1.45m2  Creatinine Delta -0.05 See Comment IMAGING: [place] Abdomen Pelvis w IV ContrastResult Date: 11/7/20241.  No acute abdominal process is seen. 2.  Incidental findings include trace, nonspecific bilateral perinephric fluid, hepatic steatosis, and small fat-containing left inguinal hernia. Radiation dose lowering was achieved by the use of an iterative reconstruction technique. PQRS:  H0362 [facility] Radiology Notification System Classification: Routine. Reported and signed by:  Roselyn CHRISTELLA Simpers, MD XR Abdomen AP And Decub or Erect ViewResult Date: 11/6/2024Findings appear most consistent with constipation. No discernible free subdiaphragmatic gas. [facility] Radiology Notification System Classification: Routine. Reported and signed by:  Lonni Cable, MD CTA Chest (PE) w IV ContrastResult Date: 11/6/2024There is a small volume of pulmonary emboli. Otherwise, no acute pulmonary process is seen.  Results were discussed with Dr. Benedetto at 2:10 PM. [facility] Radiology Notification System Classification: Routine. Estimated radiation dose: 413.72 mGy-cm Radiation dose lowering was achieved by the use of an iterative reconstruction technique. Reported and signed by:  Roselyn CHRISTELLA Simpers, MD [place] Head wo IV ContrastResult Date: 09/27/2023 No acute intracranial abnormality is seen. [facility] Radiology Notification System Classification: Routine. H0362 Reported and signed by:  Roselyn CHRISTELLA Simpers, MD XR Chest PA or AP-use if patient is unstableResult Date: 11/6/2024No acute process is seen. [facility] Radiology Notification System Classification: Routine. Reported and signed by: Roselyn CHRISTELLA Simpers, MD  ASSESSMENT/PLAN: Andrew [name] is a 68 y.o. 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 Patient was found to have bilateral pulmonary emboli on a [facility] chest from September 27, 2023. This occurs in the context of a subtherapeutic INR on October 31st 2024. Patient has been transitioned to Eliquis . There is a family history of DVT in his paternal aunt. 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 The 4G/5G genotype is associated with the intermediate PAI-1 activity and antigen levels. This mutation may potentially have been a risk factor for his clotting event. 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 Patient is now on Eliquis  5 mg twice a day. Patient will be on lifelong anticoagulation unless there is a contraindication. Hypercoagulable workup as above.New-onset AFib diagnosed in November of 2024. Patient reports he should be following up with Dr. Elsworth from Cardiology in the next several weeks. Patient's hemoglobin had been at  the upper end of normal while hospitalized. Erythropoietin  level has been within normal limits. The patient will return to see me in approximately 6 months' time, or sooner if needed. We will check a CBC with differential and CMP prior to the visit. Patient reports cyclical episodes of fatigue as well as some SOB - the SOB has been intermittent for several months. I will discuss the option of ordering a CTA chest to further evaluate for PE - would be unlikely given he is on therapeutic Eliquis  but it would be reasonable given his ongoing SOB. He will be following up with cardiology as above. I also referred him to pulmonology in [facility].TELEPHONE VISIT: For this visit the clinician and patient were present via telephone (audio only).Patient counseled on available options for visit type; Patient elected telephone visit (audio only); Patient consent given for telephone (audio only) visit : YesState patient is located in: [place]The clinician is appropriately licensed in the above state to provide care for this visitPatient Identity was confirmed during this call.  Other individuals actively participating in the telephone encounter and their name/relation to the patient: noneGreater than 10 minutes of time was spent with this patient in medical discussion to support the telephone (audio only) visit: Yes Because this visit was completed over telephone, a hands-on physical exam was not performed.  Patient understands and knows to call back if condition changes. Patients questions were answered today, I encouraged the patient to call me if they have any questions or clinical concerns. Lauraine Plume, PA-CSmilow [facility]gi [facility] at [facility] [address], [place] [address]Complexity of medical decision making:  MODERATEElectronically Signed By:   Lauraine Plume, PA

## 2024-07-25 ENCOUNTER — Telehealth: Admit: 2024-07-25 | Payer: PRIVATE HEALTH INSURANCE | Attending: Medical | Primary: Internal Medicine

## 2024-07-25 NOTE — Telephone Encounter
 Left voice mail message to let Andrew [name] know that I called the Pulmonary office.  They asked him to give the Pulmonary office a call at [phone] to schedule the appointment.

## 2024-07-25 NOTE — Telephone Encounter
 Patient calling because he has not heard from pulmonology regarding his referral.

## 2024-08-26 ENCOUNTER — Encounter: Admit: 2024-08-26 | Payer: PRIVATE HEALTH INSURANCE | Primary: Internal Medicine

## 2024-08-26 ENCOUNTER — Encounter: Admit: 2024-08-26 | Payer: PRIVATE HEALTH INSURANCE | Attending: Pulmonary Disease | Primary: Internal Medicine

## 2024-08-26 ENCOUNTER — Ambulatory Visit: Admit: 2024-08-26 | Payer: PRIVATE HEALTH INSURANCE | Attending: Pulmonary Disease | Primary: Internal Medicine

## 2024-08-26 VITALS — BP 130/76 | HR 82 | Ht 73.0 in

## 2024-08-26 DIAGNOSIS — I10 Essential (primary) hypertension: Secondary | ICD-10-CM

## 2024-08-26 DIAGNOSIS — R0609 Other forms of dyspnea: Principal | ICD-10-CM

## 2024-08-26 DIAGNOSIS — I82409 Acute embolism and thrombosis of unspecified deep veins of unspecified lower extremity: Secondary | ICD-10-CM

## 2024-08-26 DIAGNOSIS — J45998 Other asthma: Secondary | ICD-10-CM

## 2024-08-26 DIAGNOSIS — E119 Type 2 diabetes mellitus without complications: Principal | ICD-10-CM

## 2024-08-26 DIAGNOSIS — I2699 Other pulmonary embolism without acute cor pulmonale: Secondary | ICD-10-CM

## 2024-08-26 DIAGNOSIS — R053 Chronic cough: Secondary | ICD-10-CM

## 2024-08-26 DIAGNOSIS — G4733 Obstructive sleep apnea (adult) (pediatric): Secondary | ICD-10-CM

## 2024-09-04 ENCOUNTER — Inpatient Hospital Stay: Admit: 2024-09-04 | Discharge: 2024-09-04 | Payer: MEDICARE | Primary: Internal Medicine

## 2024-09-04 DIAGNOSIS — I2699 Other pulmonary embolism without acute cor pulmonale: Secondary | ICD-10-CM

## 2024-09-04 DIAGNOSIS — J45998 Other asthma: Secondary | ICD-10-CM

## 2024-09-04 DIAGNOSIS — G4733 Obstructive sleep apnea (adult) (pediatric): Secondary | ICD-10-CM

## 2024-09-04 DIAGNOSIS — R0609 Other forms of dyspnea: Secondary | ICD-10-CM

## 2024-09-04 DIAGNOSIS — R053 Chronic cough: Secondary | ICD-10-CM

## 2024-09-04 LAB — BUN/CREATININE/EGFR     (BH GH LMW Q YH)
BKR BLOOD UREA NITROGEN: 9 mg/dL (ref 7–18)
BKR BUN / CREAT RATIO: 10.1
BKR CREATININE DELTA: 0
BKR CREATININE: 0.89 mg/dL (ref 0.70–1.30)
BKR EGFR, CREATININE (CKD-EPI 2021): >60 mL/min/1.73m2 (ref >=60)

## 2024-09-04 LAB — ALPHA-1-ANTITRYPSIN: BKR ALPHA-1-ANTITRYPSIN: 147 mg/dL (ref 90–200)

## 2024-09-05 ENCOUNTER — Telehealth: Admit: 2024-09-05 | Payer: PRIVATE HEALTH INSURANCE | Attending: Hematology & Oncology | Primary: Internal Medicine

## 2024-09-05 LAB — ALLERGEN, RESPIRATORY PANEL (BH GH LMW YH)
BKR ALLERGEN ALTERNARIA ALTERNARA (TENUIS) IGE CONC: <0.1 kU/L (ref <0.10)
BKR ALLERGEN ASPERGILLUS FUMIGATUS IGE CONC: <0.1 kU/L (ref <0.10)
BKR ALLERGEN BERMUDA GRASS IGE CONC: <0.1 kU/L (ref <0.10)
BKR ALLERGEN CAT DANDER IGE CONC: <0.1 kU/L (ref <0.10)
BKR ALLERGEN CEDAR MOUNTAIN IGE CONC: <0.1 kU/L (ref <0.10)
BKR ALLERGEN CLADOSPORIUM HERBARUM IGE CONC: <0.1 kU/L (ref <0.10)
BKR ALLERGEN COMMON (SHORT) RAGWEED IGE CONC: <0.1 kU/L (ref <0.10)
BKR ALLERGEN COTTONWOOD IGE CONC: <0.1 kU/L (ref <0.10)
BKR ALLERGEN D. FARINAE IGE CONC: <0.1 kU/L (ref <0.10)
BKR ALLERGEN D. PTERONYSSINUS IGE CONC: <0.1 kU/L (ref <0.10)
BKR ALLERGEN DOG DANDER IGE CONC: <0.1 kU/L (ref <0.10)
BKR ALLERGEN ELM IGE CONC: <0.1 kU/L (ref <0.10)
BKR ALLERGEN GERMAN COCKROACH IGE CONC: 0.13 kU/L — ABNORMAL HIGH (ref <0.10)
BKR ALLERGEN MAPLE (BOX ELDER) IGE CONC: <0.1 kU/L (ref <0.10)
BKR ALLERGEN MUGWORT IGE CONC: <0.1 kU/L (ref <0.10)
BKR ALLERGEN OAK IGE CONC: <0.1 kU/L (ref <0.10)
BKR ALLERGEN PENICILLIUM CHRYSOGENUM/NOTATUM IGE CONC: <0.1 kU/L (ref <0.10)
BKR ALLERGEN ROUGH PIGWEED IGE CONC: <0.1 kU/L (ref <0.10)
BKR ALLERGEN SHEEP SORREL IGE CONC: <0.1 kU/L (ref <0.10)
BKR ALLERGEN SILVER BIRCH IGE CONC: <0.1 kU/L (ref <0.10)
BKR ALLERGEN SYCAMORE IGE CONC: <0.1 kU/L (ref <0.10)
BKR ALLERGEN TIMOTHY GRASS IGE CONC: <0.1 kU/L (ref <0.10)
BKR ALLERGEN WALNUT TREE IGE CONC: <0.1 kU/L (ref <0.10)
BKR ALLERGEN WHITE ASH IGE CONC: <0.1 kU/L (ref <0.10)
BKR ALLERGEN WHITE MULBERRY IGE CONC: <0.1 kU/L (ref <0.10)
BKR ALLERGEN, MOUSE URINE PROTEINS IGE CONC: <0.1 kU/L (ref <0.10)
BKR IGE TOTAL: 134 kU/L (ref <=214)

## 2024-09-05 NOTE — Telephone Encounter
 PA request for Eliquis  5mg  tabs renewed, Case ID [id number], Approval Dates 08/06/2024-03/04/2025

## 2024-09-09 ENCOUNTER — Inpatient Hospital Stay: Admit: 2024-09-09 | Discharge: 2024-09-09 | Payer: MEDICARE | Primary: Internal Medicine

## 2024-09-09 DIAGNOSIS — R053 Chronic cough: Secondary | ICD-10-CM

## 2024-09-09 DIAGNOSIS — R0609 Other forms of dyspnea: Principal | ICD-10-CM

## 2024-09-09 DIAGNOSIS — I2699 Other pulmonary embolism without acute cor pulmonale: Secondary | ICD-10-CM

## 2024-09-09 DIAGNOSIS — G4733 Obstructive sleep apnea (adult) (pediatric): Secondary | ICD-10-CM

## 2024-09-09 MED ORDER — ALBUTEROL SULFATE 2.5 MG/3 ML (0.083 %) SOLUTION FOR NEBULIZATION
2.5 | Freq: Once | RESPIRATORY_TRACT | Status: CP
Start: 2024-09-09 — End: ?
  Administered 2024-09-09: 18:00:00 2.5 mL via RESPIRATORY_TRACT

## 2024-09-09 NOTE — Consults
 Oximetry Rest And Exertion Report:Name: Andrew [name]  Date of Birth: [birth year]Date: September 09, 2024  Age: 68 y.o. Gender: maleNo assisted devices			.Oximetry Rest And Exertion Study:  Baseline HR 87 bpm Baseline RR 18 bpm Baseline SaO2 95 % RPE/RDP   Walk Time 6 minutes Walk Distance 1420 HR 103 bpm RR 22 bpm Sao2 98 % RPE/RDP  FiO2- % or lpm R/A  Comments/RT Assessment: Pt walked a steady pace. No rest periods. Pre BP 123/79, Post BP 131/75. Pt c/o heavy feelings in legs post walkSymptoms at end of exercise: leg painChristina Yamin Swingler, RT

## 2024-09-10 ENCOUNTER — Inpatient Hospital Stay: Admit: 2024-09-10 | Discharge: 2024-09-10 | Payer: PRIVATE HEALTH INSURANCE | Primary: Internal Medicine

## 2024-09-10 DIAGNOSIS — I2699 Other pulmonary embolism without acute cor pulmonale: Secondary | ICD-10-CM

## 2024-09-10 DIAGNOSIS — R053 Chronic cough: Secondary | ICD-10-CM

## 2024-09-10 DIAGNOSIS — R0609 Other forms of dyspnea: Principal | ICD-10-CM

## 2024-09-10 DIAGNOSIS — G4733 Obstructive sleep apnea (adult) (pediatric): Secondary | ICD-10-CM

## 2024-09-10 MED ORDER — TECHNETIUM TC-99M ALBUMIN AGGREGATED
Freq: Once | INTRAVENOUS | Status: CP | PRN
Start: 2024-09-10 — End: ?
  Administered 2024-09-10: 12:00:00 via INTRAVENOUS

## 2024-09-10 MED ORDER — IOHEXOL 350 MG IODINE/ML INTRAVENOUS SOLUTION
350 | Freq: Once | INTRAVENOUS | Status: CP | PRN
Start: 2024-09-10 — End: ?
  Administered 2024-09-10: 12:00:00 350 mL via INTRAVENOUS

## 2024-09-10 MED ORDER — SODIUM CHLORIDE 0.9 % (FLUSH) INJECTION SYRINGE
0.9 | INTRAVENOUS | Status: DC | PRN
Start: 2024-09-10 — End: 2024-09-15
  Administered 2024-09-10: 12:00:00 0.9 mL via INTRAVENOUS

## 2024-09-10 MED ORDER — TECHNETIUM TC-99M PENTETATE (TC99-DTPA) AEROSOL
Freq: Once | RESPIRATORY_TRACT | Status: CP | PRN
Start: 2024-09-10 — End: ?
  Administered 2024-09-10: 11:00:00 via RESPIRATORY_TRACT

## 2024-09-10 MED ORDER — SODIUM CHLORIDE 0.9 % LARGE VOLUME SYRINGE FOR AUTOINJECTOR
Freq: Once | INTRAVENOUS | Status: CP | PRN
Start: 2024-09-10 — End: ?
  Administered 2024-09-10: 12:00:00 via INTRAVENOUS

## 2024-09-11 ENCOUNTER — Telehealth: Admit: 2024-09-11 | Payer: PRIVATE HEALTH INSURANCE | Attending: Pulmonary Disease | Primary: Internal Medicine

## 2024-09-11 ENCOUNTER — Encounter
Admit: 2024-09-11 | Payer: PRIVATE HEALTH INSURANCE | Attending: Pulmonary Critical Care Medicine | Primary: Internal Medicine

## 2024-09-11 LAB — ALPHA-1 ANTITRYPSIN PROTEOTYPE W/REFLEX TO PHENOTYPE (BH GH LMW YH): ALPHA-1-ANTITRYPSIN, S: 137 mg/dL (ref 100–190)

## 2024-09-13 ENCOUNTER — Telehealth: Admit: 2024-09-13 | Payer: PRIVATE HEALTH INSURANCE | Attending: Pulmonary Disease | Primary: Internal Medicine

## 2024-09-15 NOTE — Procedures [3]
 PFT Interpretation: No respiratory inhalers / Non-Smoker.Flow Loop Appears Normal proximally with flattening at distal end of Loop which may suggest small airway obstruction despite normal flow by numbers.FEV1 is normal at 3.19 L which is 86% of predicted (< 80% predicted is reduced/obstructed) suggesting no Obstructive Physiology with no bronchodilator response which does not preclude a clinical trial of inhalers.FVC is normal at 4.05 L which is 81% of predicted.FEV1 /FVC Ratio is normal at 104% predicted suggesting no obstructive physiology, with no bronchodilator response which does not preclude a clinical trial of inhalers. FEF 25-75% is normal at 2.96 L which is 103% predicted suggesting No Mid Flow obstructive Physiology with no bronchodilator response which does not preclude a clinical trial of inhalers.  MVV results not reported. TLC is reduced at 6.56 L which is 78% of predicted (< 80% predicted is reduced/restrictive) suggesting Restrictive Physiology.DLCO is normal at 31.42 ml/min/mmHg which is 104% of predictive and suggestive No Gas Impairment.DLCO does require correction for Alveolar Volume Loss (DL/VA = 873% predicted) with reduced TLC/Lung Volumes.DLCO does not require correction for Hb, however, H&H done 07/09/2024 was 15.1 & 46.10 suggesting No Anemia at that time. FENO reported at 10 ppb suggesting no Airway Inflammation. Patient underwent Exercise Oximetry/6 minute walk on RA at steady pace without SOB and heavy feeling/pain in legs. HR at start was 87 and RR 18 bpm with O2 sats 95 on RA at rest. Pt walked 1420 feet in 6 minutes with HR to 103 and RR 22 and Lowest O2 Sats at 98% which suggests patient does not qualify for O2.Recommendations: Indication for test - Chronic Dyspnea on Exertion // Productive Cough // Rare Wheeze // A Fib hx // DVT - PE hx on Eliquis  // OSA - Cpap use? Consider trial inhalers (SABA/LAMA/LABA/ICS) with clinical f/u of dyspnea symptoms.Patient has productive cough on Lisinopril  vs Post Nasal Drip. Patient may have complaint of claudication on Exercise Oximetry testing (  heavy felling in my legs - leg pain with PVD/PAD to be evaluated for by referring Physician. Patient does not qualify for Oxygen Therapy on Exertion based on the above normal Exercise Oximetry test. CTA done 09/10/2024 showed no PE nor parenchymal process.V/Q Scan 09/10/2024 showed no evidence of PE with normal perfusion and ventilation scans. Cardiac Echo done 09/28/2023 showed normal LVF/RVF with no RVP reported with no TR. Consider NM Spec Scan due to dyspnea on exertion symptoms if CAD suspected clinically. Further clinical history should be gotten by the referring physician for GERD if symptoms present vs silent GERD as reflux can insult the pulmonary tree/airways/parenchyma.Follow up with referring Pulmonary Physician.Deward DOROTHA Massed, DO

## 2024-09-24 ENCOUNTER — Emergency Department: Admit: 2024-09-24 | Payer: MEDICARE | Primary: Internal Medicine

## 2024-09-24 ENCOUNTER — Ambulatory Visit: Admit: 2024-09-24 | Payer: PRIVATE HEALTH INSURANCE | Primary: Internal Medicine

## 2024-09-24 ENCOUNTER — Inpatient Hospital Stay: Admit: 2024-09-24 | Discharge: 2024-09-24 | Payer: MEDICARE | Attending: Emergency Medicine

## 2024-09-24 DIAGNOSIS — Z882 Allergy status to sulfonamides status: Secondary | ICD-10-CM

## 2024-09-24 DIAGNOSIS — R0602 Shortness of breath: Principal | ICD-10-CM

## 2024-09-24 DIAGNOSIS — T17908A Unspecified foreign body in respiratory tract, part unspecified causing other injury, initial encounter: Secondary | ICD-10-CM

## 2024-09-24 LAB — CBC WITH AUTO DIFFERENTIAL
BKR WAM ABSOLUTE IMMATURE GRANULOCYTES.: 0.01 x 1000/ÂµL (ref 0.00–0.30)
BKR WAM ABSOLUTE LYMPHOCYTE COUNT.: 1.44 x 1000/ÂµL (ref 0.60–3.70)
BKR WAM ABSOLUTE NRBC: 0 x 1000/ÂµL (ref 0.00–1.00)
BKR WAM ANC (ABSOLUTE NEUTROPHIL COUNT): 1.4 x 1000/ÂµL — ABNORMAL LOW (ref 2.00–7.60)
BKR WAM BASOPHIL ABSOLUTE COUNT.: 0.05 x 1000/ÂµL (ref 0.00–1.00)
BKR WAM BASOPHILS: 1.4 % (ref 0.0–1.4)
BKR WAM EOSINOPHIL ABSOLUTE COUNT.: 0.06 x 1000/ÂµL (ref 0.00–1.00)
BKR WAM EOSINOPHILS: 1.6 % (ref 0.0–5.0)
BKR WAM HEMATOCRIT: 43.1 % (ref 38.50–50.00)
BKR WAM HEMOGLOBIN: 14.4 g/dL (ref 13.2–17.1)
BKR WAM IMMATURE GRANULOCYTES: 0.3 % (ref 0.0–1.0)
BKR WAM LYMPHOCYTES: 39.2 % (ref 17.0–50.0)
BKR WAM MCH: 32.4 pg (ref 27.0–33.0)
BKR WAM MCHC: 33.4 g/dL (ref 31.0–36.0)
BKR WAM MCV: 96.9 fL (ref 80.0–100.0)
BKR WAM MONOCYTE ABSOLUTE COUNT.: 0.71 x 1000/ÂµL (ref 0.00–1.00)
BKR WAM MONOCYTES: 19.3 % — ABNORMAL HIGH (ref 4.0–12.0)
BKR WAM MPV: 9.5 fL (ref 8.0–12.0)
BKR WAM NEUTROPHILS: 38.2 % — ABNORMAL LOW (ref 39.0–72.0)
BKR WAM NUCLEATED RED BLOOD CELLS: 0 % (ref 0.0–1.0)
BKR WAM PLATELETS: 121 x1000/ÂµL — ABNORMAL LOW (ref 150–420)
BKR WAM RDW-CV: 14.6 % (ref 11.0–15.0)
BKR WAM RED BLOOD CELL COUNT.: 4.45 M/ÂµL (ref 4.00–6.00)
BKR WAM WHITE BLOOD CELL COUNT: 3.7 x1000/ÂµL — ABNORMAL LOW (ref 4.0–11.0)

## 2024-09-24 LAB — TROPONIN T HIGH SENSITIVITY, 1 HOUR WITH REFLEX (BH GH LMW YH)
BKR TROPONIN T HS 1 HOUR DELTA FROM 0 HOUR: -1 ng/L
BKR TROPONIN T HS 1 HOUR: 17 ng/L — ABNORMAL HIGH

## 2024-09-24 LAB — BASIC METABOLIC PANEL
BKR ANION GAP (LM): 9 mmol/L (ref 5–15)
BKR BLOOD UREA NITROGEN: 11 mg/dL (ref 7–18)
BKR CALCIUM: 9 mg/dL (ref 8.5–10.1)
BKR CHLORIDE: 109 mmol/L — ABNORMAL HIGH (ref 98–107)
BKR CO2: 22 mmol/L (ref 21–32)
BKR CREATININE DELTA: -0.2
BKR CREATININE: 0.69 mg/dL — ABNORMAL LOW (ref 0.70–1.30)
BKR EGFR, CREATININE (CKD-EPI 2021): >60 mL/min/1.73m2 (ref >=60)
BKR GLUCOSE: 129 mg/dL — ABNORMAL HIGH (ref 65–110)
BKR POTASSIUM: 3.7 mmol/L (ref 3.5–5.1)
BKR SODIUM: 140 mmol/L (ref 136–145)

## 2024-09-24 LAB — TROPONIN T HIGH SENSITIVITY, 0 HOUR BASELINE WITH REFLEX (BH GH LMW YH): BKR TROPONIN T HS 0 HOUR BASELINE: 18 ng/L — ABNORMAL HIGH

## 2024-09-24 LAB — BLOOD GAS, VENOUS     (LMW)
BKR CALCULATED HCO3, VENOUS: 21.5 mmol/L (ref 19.0–25.0)
BKR FIO2: 21 %
BKR PCO2, VENOUS: 35 mmHg — ABNORMAL LOW (ref 38–52)
BKR PH, VENOUS: 7.39 U (ref 7.32–7.42)
BKR PO2, VENOUS: 57 mmHg — ABNORMAL HIGH (ref 28–48)

## 2024-09-24 LAB — EXTRA BLUE TOP SPECIMEN     (BH GH LMW)

## 2024-09-24 LAB — TROPONIN T HIGH SENSITIVITY, 3 HOUR (BH GH LMW YH)
BKR TROPONIN T HS 1 HOUR DELTA FROM 0 HOUR ON 3HR: -1 ng/L
BKR TROPONIN T HS 3 HOUR DELTA FROM 0 HOUR: -2 ng/L
BKR TROPONIN T HS 3 HOUR: 16 ng/L — ABNORMAL HIGH

## 2024-09-24 LAB — VENOUS BLOOD GAS (LMW): BKR BILL ONLY: 1

## 2024-09-24 LAB — NT-PROBNPE: BKR B-TYPE NATRIURETIC PEPTIDE, PRO (PROBNP): 27 pg/mL (ref <125.0)

## 2024-09-24 MED ORDER — PERFLUTREN LIPID MICROSPHERES 1.3 ML/10 ML NS INJ (IN
Freq: Once | INTRAVENOUS | Status: DC | PRN
Start: 2024-09-24 — End: 2024-09-24

## 2024-09-24 NOTE — ED Provider Notes [19]
 HPI:  This is a 68 year old male patient with a chief complaint of ?choking? patient reports that he woke up choking like he could not clear his throat and feeling short of breath.  Patient says this has been going on for many months he has seen Pulmonary as well as Cardiology for this issue.  This is not the 1st episode and the other more recent episode happened a week ago when he woke up feeling like he could not breathe and that resolved.  He has no substernal chest pressure pain no diaphoresis.  He is back to his baseline.  He has an echocardiogram scheduled for 8:00 a.m. this morning to workup this very issue.Past Medical History:  Past medical history, surgical history, family history, current medications, allergies, social history reviewed by myself in nursing notes, see nursing notes for further informationVitals:  BP 131/77  - Pulse 74  - Temp 98.8 ?F (37.1 ?C) (Temporal)  - Resp 18  - Ht 6' 3 (1.905 m)  - Wt 113.4 kg  - SpO2 98%  - BMI 31.25 kg/m? Physical exam:Constitutional:  Alert no apparent distresshead:  Normocephalic and nontraumaticeyes:  No proptosis extraocular movements are intactENT:  External inspection of ears and nose reveal no acute abnormalityheart:  Regular rate and rhythm no murmurs rubs or gallopsLungs:  Clear to auscultation bilaterally with no wheeze rubs or rhonchiAbdomen:  Soft nontender nondistended positive bowel sounds throughoutExtremities:  Moves all extremities spontaneouslySkin:  No acute lesions or  rashesNeuro:  Alert with no acute focal neurologic deficitED course:  EKG in the department shows no ST elevation or depression acute ischemic injury patternPatient has chest x-ray shows no acute cardiopulmonary abnormality CBC metabolic panel are physiologically normal with the patient's troponins bumped to 18 and he will require serial troponinsHe will be signed out to the oncoming ED doc at sign-out Impression 1. Dyspnea Disposition ED observation Lulu Maude Mt, DO11/04/25 [id number] Bertolozzi, Maude Mt, DO11/04/25 0650Patient was signed out to me from overnight ED provider pending completion of cardiac enzymes.  Initial troponin is 18.  1 hour delta troponin is 17.  3 hour delta troponin is 16.  Low suspicion for ACS.  BNP is 27.  Patient had CTA on 10/21 negative for pulmonary embolism and has been compliant with his Eliquis .Patient had echo performed in the ED today demonstrating: * Normal left ventricular cavity size.  Mild concentric left ventricular hypertrophy.  No regional wall motion abnormalities.  Normal left ventricular systolic function.  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 Annitta. No further cardiac workup recommended.  Recommends outpatient follow up with ENT and pulmonology for further evaluation.All results were discussed and communicated with the patient. Patient informed to follow up with Pulmonologist and ENT for further evaluation. Patient informed to return to the Emergency Department for new or worsening symptoms. Patient is stable for discharge.  Benedetto Ozell Agent, DO11/04/25 1131

## 2024-09-24 NOTE — Discharge Instructions [18]
 Please follow up with your Pulmonologist and ENT for further evaluation. Please return to the Emergency Department for new or worsening symptoms.

## 2024-10-07 ENCOUNTER — Encounter: Admit: 2024-10-07 | Payer: PRIVATE HEALTH INSURANCE | Attending: Pulmonary Disease | Primary: Internal Medicine

## 2024-10-07 ENCOUNTER — Encounter: Admit: 2024-10-07 | Payer: PRIVATE HEALTH INSURANCE | Primary: Internal Medicine

## 2024-10-07 VITALS — BP 130/82 | HR 99 | Temp 97.50000°F | Ht 75.0 in | Wt 250.0 lb

## 2024-10-07 DIAGNOSIS — R053 Chronic cough: Secondary | ICD-10-CM

## 2024-10-07 DIAGNOSIS — R0609 Other forms of dyspnea: Principal | ICD-10-CM

## 2024-10-07 DIAGNOSIS — I2699 Other pulmonary embolism without acute cor pulmonale: Secondary | ICD-10-CM

## 2024-10-07 DIAGNOSIS — R0602 Shortness of breath: Principal | ICD-10-CM

## 2024-10-29 ENCOUNTER — Inpatient Hospital Stay: Admit: 2024-10-29 | Discharge: 2024-10-29 | Payer: MEDICARE | Attending: Emergency Medicine | Primary: Internal Medicine

## 2024-10-29 ENCOUNTER — Ambulatory Visit: Admit: 2024-10-29 | Payer: MEDICARE | Primary: Internal Medicine

## 2024-10-29 DIAGNOSIS — Z86711 Personal history of pulmonary embolism: Secondary | ICD-10-CM

## 2024-10-29 DIAGNOSIS — Z882 Allergy status to sulfonamides status: Secondary | ICD-10-CM

## 2024-10-29 DIAGNOSIS — Z7901 Long term (current) use of anticoagulants: Secondary | ICD-10-CM

## 2024-10-29 DIAGNOSIS — R0602 Shortness of breath: Principal | ICD-10-CM

## 2024-10-29 DIAGNOSIS — R059 Cough, unspecified: Secondary | ICD-10-CM

## 2024-10-29 LAB — CBC WITH AUTO DIFFERENTIAL
BKR WAM ABSOLUTE IMMATURE GRANULOCYTES.: 0.01 x 1000/ÂµL (ref 0.00–0.30)
BKR WAM ABSOLUTE LYMPHOCYTE COUNT.: 1.23 x 1000/ÂµL (ref 0.60–3.70)
BKR WAM ABSOLUTE NRBC: 0 x 1000/ÂµL (ref 0.00–1.00)
BKR WAM ANC (ABSOLUTE NEUTROPHIL COUNT): 2.38 x 1000/ÂµL (ref 2.00–7.60)
BKR WAM BASOPHIL ABSOLUTE COUNT.: 0.04 x 1000/ÂµL (ref 0.00–1.00)
BKR WAM BASOPHILS: 0.9 % (ref 0.0–1.4)
BKR WAM EOSINOPHIL ABSOLUTE COUNT.: 0.06 x 1000/ÂµL (ref 0.00–1.00)
BKR WAM EOSINOPHILS: 1.3 % (ref 0.0–5.0)
BKR WAM HEMATOCRIT: 45.4 % (ref 38.50–50.00)
BKR WAM HEMOGLOBIN: 15.5 g/dL (ref 13.2–17.1)
BKR WAM IMMATURE GRANULOCYTES: 0.2 % (ref 0.0–1.0)
BKR WAM LYMPHOCYTES: 27.5 % (ref 17.0–50.0)
BKR WAM MCH: 32.6 pg (ref 27.0–33.0)
BKR WAM MCHC: 34.1 g/dL (ref 31.0–36.0)
BKR WAM MCV: 95.6 fL (ref 80.0–100.0)
BKR WAM MONOCYTE ABSOLUTE COUNT.: 0.76 x 1000/ÂµL (ref 0.00–1.00)
BKR WAM MONOCYTES: 17 % — ABNORMAL HIGH (ref 4.0–12.0)
BKR WAM MPV: 9.7 fL (ref 8.0–12.0)
BKR WAM NEUTROPHILS: 53.1 % (ref 39.0–72.0)
BKR WAM NUCLEATED RED BLOOD CELLS: 0 % (ref 0.0–1.0)
BKR WAM PLATELETS: 105 x1000/ÂµL — ABNORMAL LOW (ref 150–420)
BKR WAM RDW-CV: 13.2 % (ref 11.0–15.0)
BKR WAM RED BLOOD CELL COUNT.: 4.75 M/ÂµL (ref 4.00–6.00)
BKR WAM WHITE BLOOD CELL COUNT: 4.5 x1000/ÂµL (ref 4.0–11.0)

## 2024-10-29 LAB — COMPREHENSIVE METABOLIC PANEL
BKR ALANINE AMINOTRANSFERASE (ALT): 54 U/L (ref 12–78)
BKR ALBUMIN: 4.2 g/dL (ref 3.4–5.0)
BKR ALKALINE PHOSPHATASE: 47 U/L (ref 45–117)
BKR ANION GAP (LM): 10 mmol/L (ref 5–15)
BKR ASPARTATE AMINOTRANSFERASE (AST): 38 U/L — ABNORMAL HIGH (ref 15–37)
BKR BILIRUBIN TOTAL: 0.7 mg/dL (ref 0.2–1.0)
BKR BLOOD UREA NITROGEN: 12 mg/dL (ref 7–18)
BKR CALCIUM: 9.6 mg/dL (ref 8.5–10.1)
BKR CHLORIDE: 101 mmol/L (ref 98–107)
BKR CO2: 25 mmol/L (ref 21–32)
BKR CREATININE DELTA: 0.18
BKR CREATININE: 0.87 mg/dL (ref 0.70–1.30)
BKR EGFR, CREATININE (CKD-EPI 2021): >60 mL/min/1.73m2 (ref >=60)
BKR GLOBULIN: 3.6 g/dL (ref 2.5–5.0)
BKR GLUCOSE: 158 mg/dL — ABNORMAL HIGH (ref 65–110)
BKR POTASSIUM: 4 mmol/L (ref 3.5–5.1)
BKR PROTEIN TOTAL: 7.8 g/dL (ref 6.4–8.2)
BKR SODIUM: 136 mmol/L (ref 136–145)

## 2024-10-29 LAB — TROPONIN T HIGH SENSITIVITY, 1 HOUR WITH REFLEX (BH GH LMW YH)
BKR TROPONIN T HS 1 HOUR DELTA FROM 0 HOUR: 0 ng/L
BKR TROPONIN T HS 1 HOUR: 13 ng/L — ABNORMAL HIGH

## 2024-10-29 LAB — PROTIME AND INR
BKR INR: 1.08 (ref 0.90–1.10)
BKR PROTHROMBIN TIME: 10.7 s (ref 9.0–12.0)

## 2024-10-29 LAB — TROPONIN T HIGH SENSITIVITY, 0 HOUR BASELINE WITH REFLEX (BH GH LMW YH): BKR TROPONIN T HS 0 HOUR BASELINE: 13 ng/L — ABNORMAL HIGH

## 2024-10-29 LAB — IMMATURE PLATELET FRACTION (BH GH LMW YH)
BKR WAM IPF, ABSOLUTE: 4.1 x1000/ÂµL (ref <20.0)
BKR WAM IPF: 3.9 % (ref 1.2–8.6)

## 2024-10-29 LAB — NT-PROBNPE: BKR B-TYPE NATRIURETIC PEPTIDE, PRO (PROBNP): 47 pg/mL (ref <125.0)

## 2024-10-29 LAB — D-DIMER, QUANTITATIVE: BKR D-DIMER: 0.34 mg{FEU}/L (ref <0.50)

## 2024-10-29 NOTE — ED Notes [6]
 7:04 PM Pt to XR.

## 2024-10-29 NOTE — ED Provider Notes [19]
 Chief Complaint:Chief Complaint Patient presents with  Shortness of Breath   Patient reports shortness of breath that has been present for months.  Worse in the last 24 hours.  +Productive cough-green phlegm.  Denies chest pain. Denies fevers at home.  YEP:Oznwjmi Corkins is a 68 y.o. male who presents to the ED with shortness of breath.  States this has been ongoing for multiple months.  He was seen in the emergency department 1 month ago for similar complaints.  At that time, he had an echocardiogram performed which showed no significant abnormality.  He did follow up with pulmonology, had normal pulmonary function testing.  He had an outpatient CTA in October of the chest which did not demonstrate any new pulmonary embolism.  He has a history of pulmonary embolism and is anticoagulated on Eliquis  but she has been compliant with.  He also had a V/Q scan at that time which did not reveal any abnormality.  He states that over the past 24 hours, his symptoms have worsened.  He states he has good days and bad days and today was 1 of his worse.  Does report slight cough that is productive however denies hemoptysis.  He denies chest pain, leg swelling, fevers.External record(s) reviewed which indicated:None Vitals:  10/29/24 1814 10/29/24 1829 10/29/24 1845 10/29/24 1848 BP: 139/80 (!) 151/86 (!) 152/85  Pulse: 82 73 75 75 Resp:  18  20 Temp:     TempSrc:     SpO2:  96% 95% 94% Weight:     Height:     Physical Exam General: Awake, alert, in no apparent distress.Head: Normocephalic, atraumatic.Respiratory: Breathing comfortably and in no respiratory distress.  Lungs are clear to auscultation.Cardiovascular: Extremities are warm and well perfused.  Regular rate and rhythm without murmurs, rubs, or gallops.Neuro: Awake and alert. Moving extremities.Psych: Answers questions appropriately. Medical Decision Making:This is a 68 year old male presenting with acute on chronic shortness of breath, worse over the last 24 hours.  History of PE however compliant with his Eliquis .  Clinical suspicion for pulmonary embolism is low however does remain on the differential and as such we will check D-dimer.  Differential also includes pneumonia, CHF, angina, pulmonary fibrosis, other.  We will plan for lab work, chest x-ray.Workup thus far reassuring.  No significant elevation in troponin.  D-dimer less than 0.5.  Chest x-ray without focal consolidation on my preliminary read.  Patient will require repeat troponin testing and as such care of the patient will be transitioned to attending physician, Dr. Dayton, at shift change. Clinical Impressions as of 10/29/24 2023 Shortness of breath Dyspnea, unspecified type  Attestation/Critical Care  Discussion of Management with other Physicians or Appropriate Source:None My Independent Interpretation of Studies: ECGRate:  76Rhythm:  Sinus rhythmNo ischemic or arrythmic abnormalitiesSimilar to ECG dated 11/04/2025ECG reviewed with Dr. Jenel Admission/Observation was considered:No Supervision:Patient was seen together with Dr. Dayton  Procedures  Harvey, Dayton, PA12/09/25 1926Patient received as a sign-out, seen evaluated at bedside.  This has otherwise well-appearing, this has not appear to be dyspneic.  Does have cardiology, pulmonology and sleep study coming up.  This has this has has been an ongoing issue for the past 6 or 7 months.  No indications for admission, labs were unremarkable.  Troponins flat.  Chest x-ray showed no signs of fluid overload.  Patient discharged with return precautions. Dayton Faden, MD12/09/25 2024

## 2024-11-07 ENCOUNTER — Telehealth: Admit: 2024-11-07 | Payer: PRIVATE HEALTH INSURANCE | Attending: Pulmonary Disease | Primary: Internal Medicine

## 2024-11-26 ENCOUNTER — Encounter: Admit: 2024-11-26 | Payer: PRIVATE HEALTH INSURANCE | Attending: Otolaryngology | Primary: Internal Medicine

## 2024-11-26 DIAGNOSIS — D689 Coagulation defect, unspecified: Secondary | ICD-10-CM

## 2024-11-26 DIAGNOSIS — I10 Essential (primary) hypertension: Secondary | ICD-10-CM

## 2024-11-26 DIAGNOSIS — E119 Type 2 diabetes mellitus without complications: Principal | ICD-10-CM

## 2024-11-26 DIAGNOSIS — G4730 Sleep apnea, unspecified: Secondary | ICD-10-CM

## 2024-11-26 DIAGNOSIS — I82409 Acute embolism and thrombosis of unspecified deep veins of unspecified lower extremity: Secondary | ICD-10-CM

## 2024-11-26 MED ORDER — OZEMPIC 0.25 MG OR 0.5 MG (2 MG/3 ML) SUBCUTANEOUS PEN INJECTOR
0.25 | SUBCUTANEOUS | 3.00 refills | 28.00000 days | Status: AC
Start: 2024-11-26 — End: ?

## 2024-11-26 MED ORDER — OMEPRAZOLE 40 MG CAPSULE,DELAYED RELEASE
40 | Freq: Two times a day (BID) | ORAL | 2.00 refills | 30.00000 days | Status: AC
Start: 2024-11-26 — End: ?

## 2024-11-26 MED ORDER — FLUTICASONE PROPIONATE 50 MCG/ACTUATION NASAL SPRAY,SUSPENSION
50 | Freq: Every day | NASAL | 3.00 refills | 60.00000 days | Status: AC
Start: 2024-11-26 — End: ?

## 2024-11-28 ENCOUNTER — Encounter: Admit: 2024-11-28 | Payer: PRIVATE HEALTH INSURANCE | Primary: Internal Medicine

## 2024-11-28 ENCOUNTER — Inpatient Hospital Stay: Admit: 2024-11-28 | Discharge: 2024-11-28 | Payer: MEDICARE | Primary: Internal Medicine

## 2024-11-28 ENCOUNTER — Encounter: Admit: 2024-11-28 | Payer: PRIVATE HEALTH INSURANCE | Attending: Otolaryngology | Primary: Internal Medicine

## 2024-11-28 DIAGNOSIS — Z6831 Body mass index (BMI) 31.0-31.9, adult: Secondary | ICD-10-CM

## 2024-11-28 DIAGNOSIS — I10 Essential (primary) hypertension: Secondary | ICD-10-CM

## 2024-11-28 DIAGNOSIS — G4733 Obstructive sleep apnea (adult) (pediatric): Principal | ICD-10-CM

## 2024-11-28 DIAGNOSIS — I4891 Unspecified atrial fibrillation: Secondary | ICD-10-CM

## 2024-11-28 DIAGNOSIS — E119 Type 2 diabetes mellitus without complications: Secondary | ICD-10-CM

## 2024-11-28 LAB — CBC WITH AUTO DIFFERENTIAL
BKR WAM ABSOLUTE IMMATURE GRANULOCYTES.: 0.01 x 1000/ÂµL (ref 0.00–0.30)
BKR WAM ABSOLUTE LYMPHOCYTE COUNT.: 1.54 x 1000/ÂµL (ref 0.60–3.70)
BKR WAM ABSOLUTE NRBC: 0 x 1000/ÂµL (ref 0.00–1.00)
BKR WAM ANC (ABSOLUTE NEUTROPHIL COUNT): 2.23 x 1000/ÂµL (ref 2.00–7.60)
BKR WAM BASOPHIL ABSOLUTE COUNT.: 0.03 x 1000/ÂµL (ref 0.00–1.00)
BKR WAM BASOPHILS: 0.6 % (ref 0.0–1.4)
BKR WAM EOSINOPHIL ABSOLUTE COUNT.: 0.07 x 1000/ÂµL (ref 0.00–1.00)
BKR WAM EOSINOPHILS: 1.5 % (ref 0.0–5.0)
BKR WAM HEMATOCRIT: 45.5 % (ref 38.50–50.00)
BKR WAM HEMOGLOBIN: 15.5 g/dL (ref 13.2–17.1)
BKR WAM IMMATURE GRANULOCYTES: 0.2 % (ref 0.0–1.0)
BKR WAM LYMPHOCYTES: 32.6 % (ref 17.0–50.0)
BKR WAM MCH: 32.3 pg (ref 27.0–33.0)
BKR WAM MCHC: 34.1 g/dL (ref 31.0–36.0)
BKR WAM MCV: 94.8 fL (ref 80.0–100.0)
BKR WAM MONOCYTE ABSOLUTE COUNT.: 0.84 x 1000/ÂµL (ref 0.00–1.00)
BKR WAM MONOCYTES: 17.8 % — ABNORMAL HIGH (ref 4.0–12.0)
BKR WAM MPV: 10.6 fL (ref 8.0–12.0)
BKR WAM NEUTROPHILS: 47.3 % (ref 39.0–72.0)
BKR WAM NUCLEATED RED BLOOD CELLS: 0 % (ref 0.0–1.0)
BKR WAM PLATELETS: 105 x1000/ÂµL — ABNORMAL LOW (ref 150–420)
BKR WAM RDW-CV: 12.9 % (ref 11.0–15.0)
BKR WAM RED BLOOD CELL COUNT.: 4.8 M/ÂµL (ref 4.00–6.00)
BKR WAM WHITE BLOOD CELL COUNT: 4.7 x1000/ÂµL (ref 4.0–11.0)

## 2024-11-28 LAB — BASIC METABOLIC PANEL
BKR ANION GAP (LM): 13 mmol/L (ref 5–15)
BKR BLOOD UREA NITROGEN: 19 mg/dL — ABNORMAL HIGH (ref 7–18)
BKR CALCIUM: 9.6 mg/dL (ref 8.5–10.1)
BKR CHLORIDE: 102 mmol/L (ref 98–107)
BKR CO2: 20 mmol/L — ABNORMAL LOW (ref 21–32)
BKR CREATININE DELTA: -0.11
BKR CREATININE: 0.76 mg/dL (ref 0.70–1.30)
BKR EGFR, CREATININE (CKD-EPI 2021): >60 mL/min/1.73m2 (ref >=60)
BKR GLUCOSE: 124 mg/dL — ABNORMAL HIGH (ref 65–110)
BKR POTASSIUM: 4 mmol/L (ref 3.5–5.1)
BKR SODIUM: 135 mmol/L — ABNORMAL LOW (ref 136–145)

## 2024-11-28 LAB — TSH: BKR THYROID STIMULATING HORMONE (LM): 1.54 u[IU]/mL (ref 0.36–3.74)

## 2024-12-02 ENCOUNTER — Ambulatory Visit: Admit: 2024-12-02 | Payer: MEDICARE | Primary: Internal Medicine

## 2024-12-02 ENCOUNTER — Encounter: Admit: 2024-12-02 | Payer: PRIVATE HEALTH INSURANCE | Attending: Otolaryngology | Primary: Internal Medicine

## 2024-12-02 ENCOUNTER — Inpatient Hospital Stay
Admit: 2024-12-02 | Discharge: 2024-12-02 | Payer: PRIVATE HEALTH INSURANCE | Attending: Otolaryngology | Primary: Internal Medicine

## 2024-12-02 DIAGNOSIS — G4730 Sleep apnea, unspecified: Secondary | ICD-10-CM

## 2024-12-02 DIAGNOSIS — Z7985 Long-term (current) use of injectable non-insulin antidiabetic drugs: Secondary | ICD-10-CM

## 2024-12-02 DIAGNOSIS — E119 Type 2 diabetes mellitus without complications: Secondary | ICD-10-CM

## 2024-12-02 DIAGNOSIS — Z7984 Long term (current) use of oral hypoglycemic drugs: Secondary | ICD-10-CM

## 2024-12-02 DIAGNOSIS — G4700 Insomnia, unspecified: Principal | ICD-10-CM

## 2024-12-02 DIAGNOSIS — Z86718 Personal history of other venous thrombosis and embolism: Secondary | ICD-10-CM

## 2024-12-02 DIAGNOSIS — Z882 Allergy status to sulfonamides status: Secondary | ICD-10-CM

## 2024-12-02 DIAGNOSIS — Z6831 Body mass index (BMI) 31.0-31.9, adult: Secondary | ICD-10-CM

## 2024-12-02 DIAGNOSIS — G4733 Obstructive sleep apnea (adult) (pediatric): Secondary | ICD-10-CM

## 2024-12-02 DIAGNOSIS — E669 Obesity, unspecified: Secondary | ICD-10-CM

## 2024-12-02 DIAGNOSIS — I10 Essential (primary) hypertension: Secondary | ICD-10-CM

## 2024-12-02 DIAGNOSIS — I82409 Acute embolism and thrombosis of unspecified deep veins of unspecified lower extremity: Secondary | ICD-10-CM

## 2024-12-02 DIAGNOSIS — Z79899 Other long term (current) drug therapy: Secondary | ICD-10-CM

## 2024-12-02 DIAGNOSIS — D689 Coagulation defect, unspecified: Secondary | ICD-10-CM

## 2024-12-02 MED ORDER — PROPOFOL 10 MG/ML INTRAVENOUS EMULSION
10 | INTRAVENOUS | Status: DC | PRN
Start: 2024-12-02 — End: 2024-12-03
  Administered 2024-12-02: 11:00:00 10 mg/mL via INTRAVENOUS

## 2024-12-02 MED ORDER — ACETAMINOPHEN 325 MG/10.15 ML ORAL SUSPENSION
325 | Freq: Once | ORAL | Status: DC
Start: 2024-12-02 — End: 2024-12-02

## 2024-12-02 MED ORDER — ACETAMINOPHEN 325 MG TABLET
325 | Freq: Once | ORAL | Status: DC
Start: 2024-12-02 — End: 2024-12-02

## 2024-12-02 NOTE — Interval H&P Note [27]
 Subsequent to admission for surgery or invasive procedure, I have reassessed the patient by examination and review of relevant data pertaining to the planned procedure. I have verified the planned procedure and there are no relevant changes since the H&P. Marland Kitchen

## 2024-12-02 NOTE — Operative Note [[id number]]
 [facility] HealthOperative NotePatient Name: Andrew [name]        [id number]      Surgery Date: 1/12/2026Surgeons and Role:   * Ole Alm RAMAN, MD - PrimaryAssistant(s):* No surgical staff found *Staff:  Circulator: Theoplis Christmas, RNScrub Person: Robbert Gills, RNPre-Op Diagnosis:  Post Operative Diagnosis:  Anesthesia Type:  Procedure:  Operative Findings (enter relevant operative findings; do not refer to an operative report that is not yet transcribed):   Procedure:  Drains:  Implants: * No implants in log * Specimens:  EBL:      Alm RAMAN Ole, MD1/12/202610:42 [facility] HealthOperative NotePatient Name: Andrew [name]        [id number]      Surgery Date: 1/12/2026Surgeons and Role:   DEWAINE Ole Alm RAMAN, MD - PrimaryAssistant(s):* No surgical staff found *Staff:  Circulator: Theoplis Christmas, RNScrub Person: Robbert Gills, RNPre-Op Diagnosis:  Obstructive sleep apneaPost Operative Diagnosis:  SameAnesthesia Type:  Local MACProcedure:  Drug-induced sleep endoscopyOperative Findings (enter relevant operative findings; do not refer to an operative report that is not yet transcribed):  Anterior-posterior collapse was observed Procedure:  The patient was taken in the operating room and induced into a state of anesthesia while a flexible laryngoscope was passed through the right naris.  Anterior-posterior collapse of the nasopharynx was observed.  There was no sign of circumferential collapse.  The patient was then awakened, and taken into the recovery room in good condition having tolerated the procedure well.Drains:  NoneImplants: * No implants in log * Specimens:  NoneEBL:    None  Alm RAMAN Ole, MD1/12/202610:42 AM

## 2024-12-02 NOTE — Anesthesia Preprocedure Evaluation [24]
 This is a 69 y.o. male scheduled for PR DISE DYN EVAL SLEEP DISORDERED BREATHING FLX DX.Review of Systems/ Medical History Patient summary, nursing notes, EKG/Cardiac Studies , Labs, pre-procedure vitals, height, weight and NPO status reviewed.No previous anesthesia concernsAnesthesia Evaluation:   Estimated body mass index.11/26/24 : 31.25 kg/m? Last patient weight recorded. 11/26/24 : 113.4 kg Last patient height recorded. 11/26/24 : 6' 3 (1.905 m) CC/HPI: Past Medical History:No date: Clotting disorderNo date: Diabetes mellitus (HC CODE)No date: DVT (deep venous thrombosis) (HC CODE)No date: HypertensionNo date: Sleep apnea    Comment:  DOES NOT TOLERATE CPAPPast Surgical History:  Past Surgical History:No date: COLONOSCOPYNo date: HERNIA REPAIR    Comment:  childNo date: Pt stated he would accept blood and blood products if needed.Cardiovascular: Patient has a history of: hypertension.   -Vascular Disease:  Negative    Respiratory:  Negative.     -Obstructive sleep apnea:   yesHEENT: Negative.Neuromuscular: NegativeSkeletal/Skin:  NegativeGastrointestinal/Genitourinary: Nutritional Disorders: Pt is obese per BMI definition-Hematological/Lymphatic: NegativeEndocrine/Metabolic: -Diabetes mellitus:  Patient has diabetes mellitus.Behavioral/Social/Psychiatric & Syndromes: NegativePhysical ExamCardiovascular:      Rhythm: regularReason: Comments: Pulmonary:    Patient's breath sounds clear to auscultationReason: Comments: Airway:  Mallampati: IITM distance: >3 FBNeck ROM: fullMouth Opening: >3cmReason: Comments: Dental:  Documentation is limited to gross visual examination or patient report and does not reflect any prior dental records or associated imaging. Omissions in documentation do not reflect absence of pathology. unremarkable  Anesthesia PlanASA 3 The primary anesthesia plan is  MAC. Perioperative Code Status confirmed: It is my understanding that the patient is currently designated as 'Full Code' and will remain so throughout the perioperative period.Anesthesia informed consent obtained.  Consent obtained from: patientType of Anesthesia informed consent obtained:  E-consentUse of blood products: consented  The post operative pain plan is per surgeon management.Anesthesiologist's Pre Op NoteI personally evaluated and examined the patient prior to the intra-operative phase of care on the day of the procedure.SABRA

## 2024-12-02 NOTE — Anesthesia Procedure/Diagnosis Confirmation Note [112001]
 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

## 2024-12-02 NOTE — PACU Transfer of Care [100004]
 Post Anesthesia Transfer of Care NotePatient: Andrew BrennanProcedure(s) Performed: Procedure(s) (LRB):PR DISE DYN EVAL SLEEP DISORDERED BREATHING FLX DX (N/A)Last Vitals: I have reviewed the post-operative vital signs during the handoff as noted in the Epic chart.POSTOP HANDOFF :      Patient Location:  PACU     Level of Consciousness:  Sedated     VS stable since last recorded intra-op set? Yes       Oxygen source: room airPatient co-morbidities, intra-operative course, intake & output and antibiotics as per Anesthesia record were discussed with the RN.

## 2024-12-03 ENCOUNTER — Encounter: Admit: 2024-12-03 | Payer: PRIVATE HEALTH INSURANCE | Attending: Otolaryngology | Primary: Internal Medicine

## 2024-12-03 DIAGNOSIS — G4730 Sleep apnea, unspecified: Secondary | ICD-10-CM

## 2024-12-03 DIAGNOSIS — E119 Type 2 diabetes mellitus without complications: Principal | ICD-10-CM

## 2024-12-03 DIAGNOSIS — D689 Coagulation defect, unspecified: Secondary | ICD-10-CM

## 2024-12-03 DIAGNOSIS — I82409 Acute embolism and thrombosis of unspecified deep veins of unspecified lower extremity: Secondary | ICD-10-CM

## 2024-12-03 DIAGNOSIS — I10 Essential (primary) hypertension: Secondary | ICD-10-CM

## 2024-12-03 NOTE — Anesthesia Postprocedure Evaluation [25]
 Anesthesia Post-op NotePatient: Andrew BrennanProcedure(s):  Procedure(s) (LRB):PR DISE DYN EVAL SLEEP DISORDERED BREATHING FLX DX (N/A) Last Vitals:  I have reviewed the post-operative vital signs as noted in the Epic chart.POSTOP EVALUATION:      Patient Recovery Location:  PACU     Vital Signs Status:  Stable     Patient Participation:  Patient participated     Mental Status:  Awake     Respiratory Status:  Acceptable     Airway Patency:  Patent     Cardiovascular/Hydration Status:  Stable     Pain Management:  Satisfactory to patient     Nausea/Vomiting Status:  Satisfactory to patientNo notable events documented.

## 2024-12-03 NOTE — Plan of Care [1000001]
 12/03/24 0914 General Information Contact made? No Which attempt is this? 1 Plan of Care Overview/ Patient Status

## 2024-12-16 ENCOUNTER — Encounter: Admit: 2024-12-16 | Payer: PRIVATE HEALTH INSURANCE | Attending: Otolaryngology | Primary: Internal Medicine

## 2024-12-16 DIAGNOSIS — I82409 Acute embolism and thrombosis of unspecified deep veins of unspecified lower extremity: Secondary | ICD-10-CM

## 2024-12-16 DIAGNOSIS — D689 Coagulation defect, unspecified: Secondary | ICD-10-CM

## 2024-12-16 DIAGNOSIS — E119 Type 2 diabetes mellitus without complications: Principal | ICD-10-CM

## 2024-12-16 DIAGNOSIS — G4730 Sleep apnea, unspecified: Secondary | ICD-10-CM

## 2024-12-16 DIAGNOSIS — I10 Essential (primary) hypertension: Secondary | ICD-10-CM

## 2024-12-23 ENCOUNTER — Ambulatory Visit: Admit: 2024-12-23 | Payer: MEDICARE | Attending: Anesthesiology | Primary: Internal Medicine

## 2024-12-23 ENCOUNTER — Inpatient Hospital Stay
Admit: 2024-12-23 | Discharge: 2024-12-23 | Payer: PRIVATE HEALTH INSURANCE | Attending: Otolaryngology | Primary: Internal Medicine

## 2024-12-23 ENCOUNTER — Encounter: Admit: 2024-12-23 | Payer: PRIVATE HEALTH INSURANCE | Attending: Otolaryngology | Primary: Internal Medicine

## 2024-12-23 DIAGNOSIS — I4891 Unspecified atrial fibrillation: Secondary | ICD-10-CM

## 2024-12-23 DIAGNOSIS — Z7984 Long term (current) use of oral hypoglycemic drugs: Secondary | ICD-10-CM

## 2024-12-23 DIAGNOSIS — Z882 Allergy status to sulfonamides status: Secondary | ICD-10-CM

## 2024-12-23 DIAGNOSIS — I10 Essential (primary) hypertension: Secondary | ICD-10-CM

## 2024-12-23 DIAGNOSIS — G4733 Obstructive sleep apnea (adult) (pediatric): Principal | ICD-10-CM

## 2024-12-23 DIAGNOSIS — Z79899 Other long term (current) drug therapy: Secondary | ICD-10-CM

## 2024-12-23 DIAGNOSIS — Z86711 Personal history of pulmonary embolism: Secondary | ICD-10-CM

## 2024-12-23 DIAGNOSIS — Z86718 Personal history of other venous thrombosis and embolism: Secondary | ICD-10-CM

## 2024-12-23 DIAGNOSIS — Z6830 Body mass index (BMI) 30.0-30.9, adult: Secondary | ICD-10-CM

## 2024-12-23 DIAGNOSIS — E119 Type 2 diabetes mellitus without complications: Secondary | ICD-10-CM

## 2024-12-23 DIAGNOSIS — D689 Coagulation defect, unspecified: Secondary | ICD-10-CM

## 2024-12-23 DIAGNOSIS — E669 Obesity, unspecified: Secondary | ICD-10-CM

## 2024-12-23 DIAGNOSIS — Z7985 Long-term (current) use of injectable non-insulin antidiabetic drugs: Secondary | ICD-10-CM

## 2024-12-23 DIAGNOSIS — G4730 Sleep apnea, unspecified: Secondary | ICD-10-CM

## 2024-12-23 DIAGNOSIS — I82409 Acute embolism and thrombosis of unspecified deep veins of unspecified lower extremity: Secondary | ICD-10-CM

## 2024-12-23 DIAGNOSIS — I2699 Other pulmonary embolism without acute cor pulmonale: Secondary | ICD-10-CM

## 2024-12-23 MED ORDER — DIMENHYDRINATE 5 MG/ML IN 0.9% SODIUM CHLORIDE
INTRAVENOUS | Status: DC | PRN
Start: 2024-12-23 — End: 2024-12-24

## 2024-12-23 MED ORDER — LIDOCAINE 1 %-EPINEPHRINE 1:100,000 INJECTION SOLUTION
1 | Status: CP
Start: 2024-12-23 — End: ?

## 2024-12-23 MED ORDER — CEFAZOLIN IV PUSH 1 GRAM VIAL & 0.9% SODIUM CHLORIDE (ADULT)
Freq: Once | INTRAVENOUS | Status: DC
Start: 2024-12-23 — End: 2024-12-24

## 2024-12-23 MED ORDER — NALOXONE 0.4 MG/ML INJECTION SOLUTION
0.4 | INTRAVENOUS | Status: DC | PRN
Start: 2024-12-23 — End: 2024-12-24

## 2024-12-23 MED ORDER — SODIUM CHLORIDE 0.9 % (FLUSH) INJECTION SYRINGE
0.9 | Freq: Three times a day (TID) | INTRAVENOUS | Status: DC
Start: 2024-12-23 — End: 2024-12-24

## 2024-12-23 MED ORDER — INSULIN LISPRO (U-100) 100 UNIT/ML SUBCUTANEOUS SOLUTION
100 | Freq: Once | SUBCUTANEOUS | Status: DC | PRN
Start: 2024-12-23 — End: 2024-12-24

## 2024-12-23 MED ORDER — LACTATED RINGERS INTRAVENOUS SOLUTION
INTRAVENOUS | Status: DC
Start: 2024-12-23 — End: 2024-12-24
  Administered 2024-12-23: 13:00:00 1000.000 mL/h via INTRAVENOUS

## 2024-12-23 MED ORDER — BACITRACIN ZINC 500 UNIT/GRAM TOPICAL OINTMENT
500 | Status: CP
Start: 2024-12-23 — End: ?

## 2024-12-23 MED ORDER — PROPOFOL 10 MG/ML INTRAVENOUS EMULSION
10 | Status: CP
Start: 2024-12-23 — End: ?

## 2024-12-23 MED ORDER — PROPOFOL 10 MG/ML INTRAVENOUS EMULSION
10 | INTRAVENOUS | Status: DC | PRN
Start: 2024-12-23 — End: 2024-12-23
  Administered 2024-12-23: 15:00:00 10 mL/h via INTRAVENOUS

## 2024-12-23 MED ORDER — BACITRACIN ZINC 500 UNIT/GRAM TOPICAL OINTMENT
500 | Status: DC | PRN
Start: 2024-12-23 — End: 2024-12-23
  Administered 2024-12-23: 16:00:00 500 [IU]/g via TOPICAL

## 2024-12-23 MED ORDER — GLUCAGON 1 MG/ML IN STERILE WATER
Freq: Once | INTRAMUSCULAR | Status: DC | PRN
Start: 2024-12-23 — End: 2024-12-24

## 2024-12-23 MED ORDER — PHENYLEPHRINE 1 MG/10 ML (100 MCG/ML) IN 0.9 % SOD.CHLORIDE IV SYRINGE
1 | INTRAVENOUS | Status: DC | PRN
Start: 2024-12-23 — End: 2024-12-23
  Administered 2024-12-23 (×3): 1 mg/0 mL (00 mcg/mL) via INTRAVENOUS

## 2024-12-23 MED ORDER — DEXTROSE 10 % IV BOLUS FOR D10 & D50 ORDERABLE FOR HYPOGLYCEMIA (DROPS CHARGE)
INTRAVENOUS | Status: DC | PRN
Start: 2024-12-23 — End: 2024-12-24

## 2024-12-23 MED ORDER — NALOXONE 4 MG/ACTUATION NASAL SPRAY
4 | NASAL | 2 refills | 1.00000 days | Status: AC
Start: 2024-12-23 — End: ?

## 2024-12-23 MED ORDER — ACETAMINOPHEN 325 MG/10.15 ML ORAL SUSPENSION
325 | Freq: Once | ORAL | Status: CP
Start: 2024-12-23 — End: ?

## 2024-12-23 MED ORDER — SODIUM CHLORIDE 0.9 % (FLUSH) INJECTION SYRINGE
0.9 | INTRAVENOUS | Status: DC | PRN
Start: 2024-12-23 — End: 2024-12-24

## 2024-12-23 MED ORDER — PHENYLEPHRINE 1 MG/10 ML (100 MCG/ML) IN 0.9 % SOD.CHLORIDE IV SYRINGE
1 | Status: CP
Start: 2024-12-23 — End: ?

## 2024-12-23 MED ORDER — EPHEDRINE 25 MG/5 ML IN 0.9% SODIUM CHLORIDE (WRAPPED ERX)
25 | INTRAVENOUS | Status: DC | PRN
Start: 2024-12-23 — End: 2024-12-23
  Administered 2024-12-23 (×2): 25 mg/5 mL (5 mg/mL) via INTRAVENOUS

## 2024-12-23 MED ORDER — ACETAMINOPHEN 500 MG TABLET
500 | Freq: Four times a day (QID) | ORAL | Status: DC
Start: 2024-12-23 — End: 2024-12-24

## 2024-12-23 MED ORDER — ACETAMINOPHEN 325 MG TABLET
325 | Freq: Once | ORAL | Status: CP
Start: 2024-12-23 — End: ?
  Administered 2024-12-23: 13:00:00 325 mg via ORAL

## 2024-12-23 MED ORDER — KETOROLAC 30 MG/ML (1 ML) INJECTION SOLUTION
30 | Status: CP
Start: 2024-12-23 — End: ?

## 2024-12-23 MED ORDER — LIDOCAINE (PF) 20 MG/ML (2 %) INTRAVENOUS SOLUTION
20 | INTRAVENOUS | Status: DC | PRN
Start: 2024-12-23 — End: 2024-12-23
  Administered 2024-12-23: 14:00:00 20 mg/mL (2 %) via INTRAVENOUS

## 2024-12-23 MED ORDER — LIDOCAINE 1 %-EPINEPHRINE 1:100,000 INJECTION SOLUTION
1 | Status: DC | PRN
Start: 2024-12-23 — End: 2024-12-23
  Administered 2024-12-23: 16:00:00 1 %-:00,000 via EPIDURAL

## 2024-12-23 MED ORDER — MIDAZOLAM (PF) 1 MG/ML INJECTION SOLUTION
1 | INTRAVENOUS | Status: DC | PRN
Start: 2024-12-23 — End: 2024-12-23
  Administered 2024-12-23: 14:00:00 1 mg/mL via INTRAVENOUS

## 2024-12-23 MED ORDER — ONDANSETRON HCL (PF) 4 MG/2 ML INJECTION SOLUTION
4 | Status: CP
Start: 2024-12-23 — End: ?

## 2024-12-23 MED ORDER — FENTANYL (PF) 50 MCG/ML (WRAPPED ERX) INJECTION
50 | Status: CP
Start: 2024-12-23 — End: ?

## 2024-12-23 MED ORDER — SUCCINYLCHOLINE CHLOR 100 MG/5ML (20 MG/ML) IV SYRINGE (WRAPPED ERX)
100 | Status: CP
Start: 2024-12-23 — End: ?

## 2024-12-23 MED ORDER — LIDOCAINE (PF) 20 MG/ML (2 %) INTRAVENOUS SOLUTION
20 | Status: CP
Start: 2024-12-23 — End: ?

## 2024-12-23 MED ORDER — MIDAZOLAM (PF) 1 MG/ML INJECTION SOLUTION
1 | Status: CP
Start: 2024-12-23 — End: ?

## 2024-12-23 MED ORDER — PROPOFOL 10 MG/ML INTRAVENOUS EMULSION
10 | INTRAVENOUS | Status: DC | PRN
Start: 2024-12-23 — End: 2024-12-23
  Administered 2024-12-23 (×2): 10 mg/mL via INTRAVENOUS

## 2024-12-23 MED ORDER — FENTANYL (PF) 50 MCG/ML (WRAPPED ERX) INJECTION
50 | INTRAVENOUS | Status: DC | PRN
Start: 2024-12-23 — End: 2024-12-23
  Administered 2024-12-23 (×2): 50 ug/mL via INTRAVENOUS

## 2024-12-23 MED ORDER — CEFAZOLIN 1 GRAM SOLUTION FOR INJECTION
1 | INTRAVENOUS | Status: DC | PRN
Start: 2024-12-23 — End: 2024-12-23
  Administered 2024-12-23: 15:00:00 1 g via INTRAVENOUS

## 2024-12-23 MED ORDER — MIDAZOLAM (PF) 1 MG/ML INJECTION SOLUTION
1 | Freq: Once | INTRAVENOUS | Status: CP | PRN
Start: 2024-12-23 — End: ?
  Administered 2024-12-23: 17:00:00 1 mL via INTRAVENOUS

## 2024-12-23 MED ORDER — ONDANSETRON HCL (PF) 4 MG/2 ML INJECTION SOLUTION
4 | INTRAVENOUS | Status: DC | PRN
Start: 2024-12-23 — End: 2024-12-23
  Administered 2024-12-23: 15:00:00 4 via INTRAVENOUS

## 2024-12-23 MED ORDER — MEPERIDINE 25 MG/2.5 ML IN 0.9% SODIUM CHLORIDE
INTRAVENOUS | 1 refills | Status: DC | PRN
Start: 2024-12-23 — End: 2024-12-24

## 2024-12-23 MED ORDER — PROCHLORPERAZINE EDISYLATE 10 MG/2 ML (5 MG/ML) INJECTION SOLUTION
10 | INTRAVENOUS | Status: DC | PRN
Start: 2024-12-23 — End: 2024-12-24

## 2024-12-23 MED ORDER — KETOROLAC 30 MG/ML (1 ML) INJECTION SOLUTION
30 | INTRAVENOUS | Status: DC | PRN
Start: 2024-12-23 — End: 2024-12-23
  Administered 2024-12-23: 15:00:00 30 mg/mL via INTRAVENOUS

## 2024-12-23 MED ORDER — EPHEDRINE 25 MG/5 ML IN 0.9% SODIUM CHLORIDE (WRAPPED ERX)
25 | Status: CP
Start: 2024-12-23 — End: ?

## 2024-12-23 MED ORDER — SUCCINYLCHOLINE CHLOR 100 MG/5ML (20 MG/ML) IV SYRINGE (WRAPPED ERX)
100 | INTRAVENOUS | Status: DC | PRN
Start: 2024-12-23 — End: 2024-12-23
  Administered 2024-12-23: 14:00:00 100 mg/5 mL (20 mg/mL) via INTRAVENOUS

## 2024-12-23 NOTE — Interval H&P Note [27]
 Subsequent to admission for surgery or invasive procedure, I have reassessed the patient by examination and review of relevant data pertaining to the planned procedure. I have verified the planned procedure and there are no relevant changes since the H&P. Marland Kitchen

## 2024-12-23 NOTE — Discharge Instructions [18]
 Keep the wound dry for 48 hours, then you may shower, washing the area with soap and water.  However, do not soak the wound, (as you would in a bathtub), for at least 10 days.After you remove bandage in 2 days, Apply bacitracin ointment to the wound twice a day.  A bandage may be used but is not necessary as long as the antibacterial ointment is applied.You may ice the areas for 20 min every two hours to help with pain.Avoid bending lifting or straining for two weeks.  Do 10 clockwise and 10 counter-clockwise head rotations, three times a day for the first week, to make sure the neck range of motion is maintained.Call our office with any problems such as increasing redness, increasing pain, fever, or discharge from the wound other than minimal bleeding which is expected.[phone]

## 2024-12-23 NOTE — Anesthesia Procedure/Diagnosis Confirmation Note [112001]
Operative Diagnosis:Pre-op:   * No pre-op diagnosis entered * Patient Coded Diagnosis   Pre-op diagnosis: Obstructive sleep apnea (adult) (pediatric)  Post-op diagnosis: Obstructive sleep apnea (adult) (pediatric)  Patient Diagnosis   None    Post-op diagnosis:   * Obstructive sleep apnea (adult) (pediatric) [G47.33]Operative Procedure(s) :Procedure(s) (LRB):OPEN IMPLTJ HPGLSL NRV NSTIM RA PG&RESPIR SENSOR (Right)Post-op Procedure & Diagnosis ConfirmationPost-op Diagnosis: Post-op Diagnosis confirmed (no changes)Post-op Procedure: Post-op Procedure confirmed (no changes)

## 2024-12-24 ENCOUNTER — Encounter: Admit: 2024-12-24 | Payer: PRIVATE HEALTH INSURANCE | Attending: Pulmonary Disease | Primary: Internal Medicine

## 2024-12-24 NOTE — Plan of Care [1000001]
 Plan of Care Overview/ Patient Status 12/24/24 1036 General Information Contact made? No Which attempt is this? 1 Call Complete Post-op Call Complete Y

## 2025-01-21 ENCOUNTER — Ambulatory Visit: Admit: 2025-01-21 | Payer: PRIVATE HEALTH INSURANCE | Attending: Hematology & Oncology | Primary: Internal Medicine

## 2025-02-03 ENCOUNTER — Ambulatory Visit: Admit: 2025-02-03 | Payer: PRIVATE HEALTH INSURANCE | Attending: Pulmonary Disease | Primary: Internal Medicine

## 2025-02-03 ENCOUNTER — Ambulatory Visit: Admit: 2025-02-03 | Payer: PRIVATE HEALTH INSURANCE | Primary: Internal Medicine

## 2025-02-18 ENCOUNTER — Encounter: Admit: 2025-02-18 | Payer: PRIVATE HEALTH INSURANCE | Attending: Sleep Medicine | Primary: Internal Medicine

## 2025-03-24 ENCOUNTER — Encounter: Admit: 2025-03-24 | Payer: PRIVATE HEALTH INSURANCE | Attending: Pulmonary Disease | Primary: Internal Medicine
